# Patient Record
Sex: Female | Born: 1935
Health system: Southern US, Community
[De-identification: ages and names within clinical notes are randomized; demographics above are authoritative.]

## PROBLEM LIST (undated history)

## (undated) DIAGNOSIS — F329 Major depressive disorder, single episode, unspecified: Secondary | ICD-10-CM

## (undated) DIAGNOSIS — F419 Anxiety disorder, unspecified: Secondary | ICD-10-CM

## (undated) DIAGNOSIS — M1990 Unspecified osteoarthritis, unspecified site: Secondary | ICD-10-CM

## (undated) DIAGNOSIS — E785 Hyperlipidemia, unspecified: Secondary | ICD-10-CM

## (undated) HISTORY — DX: Unspecified osteoarthritis, unspecified site: M19.90

## (undated) HISTORY — PX: ABDOMINAL HYSTERECTOMY: SHX81

## (undated) HISTORY — DX: Hyperlipidemia, unspecified: E78.5

## (undated) HISTORY — DX: Major depressive disorder, single episode, unspecified: F32.9

## (undated) HISTORY — DX: Anxiety disorder, unspecified: F41.9

---

## 2002-03-03 LAB — HM COLONOSCOPY: HM Colonoscopy: NORMAL

## 2006-01-08 ENCOUNTER — Emergency Department (HOSPITAL_COMMUNITY): Admission: EM | Admit: 2006-01-08 | Discharge: 2006-01-09 | Payer: Self-pay | Admitting: Emergency Medicine

## 2009-12-13 ENCOUNTER — Emergency Department (HOSPITAL_COMMUNITY): Admission: EM | Admit: 2009-12-13 | Discharge: 2009-12-13 | Payer: Self-pay | Admitting: Emergency Medicine

## 2009-12-24 ENCOUNTER — Emergency Department (HOSPITAL_COMMUNITY): Admission: EM | Admit: 2009-12-24 | Discharge: 2009-12-24 | Payer: Self-pay | Admitting: Family Medicine

## 2009-12-30 ENCOUNTER — Ambulatory Visit: Payer: Self-pay | Admitting: Internal Medicine

## 2009-12-30 DIAGNOSIS — M810 Age-related osteoporosis without current pathological fracture: Secondary | ICD-10-CM | POA: Insufficient documentation

## 2009-12-30 DIAGNOSIS — M1990 Unspecified osteoarthritis, unspecified site: Secondary | ICD-10-CM | POA: Insufficient documentation

## 2009-12-30 DIAGNOSIS — E785 Hyperlipidemia, unspecified: Secondary | ICD-10-CM | POA: Insufficient documentation

## 2009-12-30 LAB — CONVERTED CEMR LAB
AST: 26 units/L (ref 0–37)
Alkaline Phosphatase: 75 units/L (ref 39–117)
BUN: 25 mg/dL — ABNORMAL HIGH (ref 6–23)
Basophils Absolute: 0 10*3/uL (ref 0.0–0.1)
Bilirubin Urine: NEGATIVE
Bilirubin, Direct: 0.1 mg/dL (ref 0.0–0.3)
CO2: 31 meq/L (ref 19–32)
Cholesterol, target level: 200 mg/dL
Cholesterol: 232 mg/dL — ABNORMAL HIGH (ref 0–200)
GFR calc non Af Amer: 46.76 mL/min (ref >60)
Glucose, Bld: 93 mg/dL (ref 70–99)
HCT: 41.8 % (ref 36.0–46.0)
HDL goal, serum: 40 mg/dL
HDL: 45.8 mg/dL (ref >39.00)
Hemoglobin, Urine: NEGATIVE
Leukocytes, UA: NEGATIVE
MCHC: 34.7 g/dL (ref 30.0–36.0)
MCV: 90.9 fL (ref 78.0–100.0)
Monocytes Relative: 7.4 % (ref 3.0–12.0)
Neutrophils Relative %: 62.5 % (ref 43.0–77.0)
Nitrite: NEGATIVE
Platelets: 327 10*3/uL (ref 150.0–400.0)
Potassium: 5.3 meq/L — ABNORMAL HIGH (ref 3.5–5.1)
Sed Rate: 30 mm/hr — ABNORMAL HIGH (ref 0–22)
Sodium: 144 meq/L (ref 135–145)
TSH: 2.14 microintl units/mL (ref 0.35–5.50)
Total Bilirubin: 0.6 mg/dL (ref 0.3–1.2)
Total CK: 95 units/L (ref 7–177)
Total Protein: 7.9 g/dL (ref 6.0–8.3)
Triglycerides: 153 mg/dL — ABNORMAL HIGH (ref 0.0–149.0)
Urobilinogen, UA: 0.2 (ref 0.0–1.0)
Vit D, 25-Hydroxy: 65 ng/mL (ref 30–89)
WBC: 8.8 10*3/uL (ref 4.5–10.5)

## 2009-12-31 ENCOUNTER — Encounter: Payer: Self-pay | Admitting: Internal Medicine

## 2010-01-03 ENCOUNTER — Encounter: Payer: Self-pay | Admitting: Internal Medicine

## 2010-01-03 ENCOUNTER — Ambulatory Visit: Payer: Self-pay | Admitting: Internal Medicine

## 2010-01-05 LAB — HM MAMMOGRAPHY: HM Mammogram: NORMAL

## 2010-01-07 ENCOUNTER — Telehealth: Payer: Self-pay | Admitting: Internal Medicine

## 2010-01-19 ENCOUNTER — Encounter: Payer: Self-pay | Admitting: Internal Medicine

## 2010-02-07 ENCOUNTER — Ambulatory Visit: Payer: Self-pay | Admitting: Internal Medicine

## 2010-02-07 LAB — CONVERTED CEMR LAB: Potassium: 4.8 meq/L (ref 3.5–5.1)

## 2010-02-08 ENCOUNTER — Telehealth: Payer: Self-pay | Admitting: Internal Medicine

## 2010-03-02 ENCOUNTER — Ambulatory Visit: Payer: Self-pay | Admitting: Internal Medicine

## 2010-04-05 ENCOUNTER — Ambulatory Visit: Payer: Self-pay | Admitting: Internal Medicine

## 2010-04-18 ENCOUNTER — Telehealth: Payer: Self-pay | Admitting: Internal Medicine

## 2010-04-21 ENCOUNTER — Ambulatory Visit: Payer: Self-pay | Admitting: Internal Medicine

## 2010-04-21 DIAGNOSIS — J3089 Other allergic rhinitis: Secondary | ICD-10-CM | POA: Insufficient documentation

## 2010-04-21 DIAGNOSIS — J019 Acute sinusitis, unspecified: Secondary | ICD-10-CM

## 2010-05-11 ENCOUNTER — Ambulatory Visit: Payer: Self-pay | Admitting: Internal Medicine

## 2010-05-11 ENCOUNTER — Encounter: Payer: Self-pay | Admitting: Internal Medicine

## 2010-05-11 ENCOUNTER — Telehealth: Payer: Self-pay | Admitting: Internal Medicine

## 2010-05-11 DIAGNOSIS — R222 Localized swelling, mass and lump, trunk: Secondary | ICD-10-CM

## 2010-05-11 DIAGNOSIS — R079 Chest pain, unspecified: Secondary | ICD-10-CM

## 2010-05-11 DIAGNOSIS — R1013 Epigastric pain: Secondary | ICD-10-CM | POA: Insufficient documentation

## 2010-05-11 DIAGNOSIS — R05 Cough: Secondary | ICD-10-CM

## 2010-05-11 LAB — CONVERTED CEMR LAB
ALT: 25 units/L (ref 0–35)
AST: 25 units/L (ref 0–37)
Albumin: 4 g/dL (ref 3.5–5.2)
Alkaline Phosphatase: 59 units/L (ref 39–117)
Amylase: 111 units/L (ref 27–131)
BUN: 33 mg/dL — ABNORMAL HIGH (ref 6–23)
Basophils Absolute: 0.1 10*3/uL (ref 0.0–0.1)
Basophils Relative: 0.8 % (ref 0.0–3.0)
Bilirubin Urine: NEGATIVE
Bilirubin, Direct: 0.1 mg/dL (ref 0.0–0.3)
Blood, UA: NEGATIVE
CO2: 30 meq/L (ref 19–32)
CRP, High Sensitivity: 0.79 (ref 0.00–5.00)
Calcium: 9.6 mg/dL (ref 8.4–10.5)
Chloride: 104 meq/L (ref 96–112)
Creatinine, Ser: 1.1 mg/dL (ref 0.4–1.2)
Eosinophils Absolute: 0.1 10*3/uL (ref 0.0–0.7)
Eosinophils Relative: 1.9 % (ref 0.0–5.0)
GFR calc non Af Amer: 50.62 mL/min — ABNORMAL LOW (ref >60.00)
Glucose, Bld: 75 mg/dL (ref 70–99)
HCT: 38.3 % (ref 36.0–46.0)
Hemoglobin: 13.2 g/dL (ref 12.0–15.0)
Ketones, ur: NEGATIVE mg/dL
Lipase: 42 units/L (ref 11.0–59.0)
Lymphocytes Relative: 26.3 % (ref 12.0–46.0)
Lymphs Abs: 2 10*3/uL (ref 0.7–4.0)
MCHC: 34.6 g/dL (ref 30.0–36.0)
MCV: 91.6 fL (ref 78.0–100.0)
Monocytes Absolute: 0.6 10*3/uL (ref 0.1–1.0)
Monocytes Relative: 8.2 % (ref 3.0–12.0)
Neutro Abs: 4.8 10*3/uL (ref 1.4–7.7)
Neutrophils Relative %: 62.8 % (ref 43.0–77.0)
Nitrite: NEGATIVE
Platelets: 316 10*3/uL (ref 150.0–400.0)
Potassium: 5.3 meq/L — ABNORMAL HIGH (ref 3.5–5.1)
Pro B Natriuretic peptide (BNP): 18.6 pg/mL (ref 0.0–100.0)
RBC: 4.18 M/uL (ref 3.87–5.11)
RDW: 13.6 % (ref 11.5–14.6)
Sodium: 141 meq/L (ref 135–145)
Specific Gravity, Urine: 1.02 (ref 1.000–1.030)
TSH: 1.58 microintl units/mL (ref 0.35–5.50)
Total Bilirubin: 0.6 mg/dL (ref 0.3–1.2)
Total CK: 66 units/L (ref 7–177)
Total Protein, Urine: NEGATIVE mg/dL
Total Protein: 7 g/dL (ref 6.0–8.3)
Urine Glucose: NEGATIVE mg/dL
Urobilinogen, UA: 0.2 (ref 0.0–1.0)
WBC: 7.7 10*3/uL (ref 4.5–10.5)
pH: 6 (ref 5.0–8.0)

## 2010-05-13 ENCOUNTER — Encounter: Payer: Self-pay | Admitting: Internal Medicine

## 2010-05-17 ENCOUNTER — Telehealth: Payer: Self-pay | Admitting: Internal Medicine

## 2010-05-17 ENCOUNTER — Ambulatory Visit: Payer: Self-pay | Admitting: Internal Medicine

## 2010-06-21 NOTE — Assessment & Plan Note (Signed)
Summary: FLU SHOT/PN  Nurse Visit   Allergies: 1)  ! Niacin (Niacin)  Orders Added: 1)  Admin 1st Vaccine [90471] 2)  Flu Vaccine 17yrs + [09811]     Flu Vaccine Consent Questions     Do you have a history of severe allergic reactions to this vaccine? no    Any prior history of allergic reactions to egg and/or gelatin? no    Do you have a sensitivity to the preservative Thimersol? no    Do you have a past history of Guillan-Barre Syndrome? no    Do you currently have an acute febrile illness? no    Have you ever had a severe reaction to latex? no    Vaccine information given and explained to patient? yes    Are you currently pregnant? no    Lot Number:[id number]   Exp Date:11/19/2010   Site Given  RIGHT Deltoid IM1

## 2010-06-21 NOTE — Assessment & Plan Note (Signed)
Summary: COUGH--CONGESTION--STC   Vital Signs:  Patient profile:   77 year old female Menstrual status:  hysterectomy Height:      59 inches Weight:      145 pounds BMI:     29.39 O2 Sat:      98 % on Room air Temp:     98.4 degrees F oral Pulse rate:   88 / minute Pulse rhythm:   regular Resp:     16 per minute BP sitting:   138 / 70  (left arm) Cuff size:   regular  Vitals Entered By: Rock Nephew CMA (April 21, 2010 11:25 AM)  Nutrition Counseling: Patient's BMI is greater than 25 and therefore counseled on weight management options.  O2 Flow:  Room air CC: Coug, congestion, bodyache, URI symptoms Is Patient Diabetic? No Pain Assessment Patient in pain? no          Menstrual Status hysterectomy   Primary Care Nickie Deren:  Etta Grandchild MD  CC:  Coug, congestion, bodyache, and URI symptoms.  History of Present Illness:  URI Symptoms      This is a 77 year old woman who presents with URI symptoms.  The symptoms began 5 days ago.  The severity is described as moderate.  The patient reports nasal congestion, purulent nasal discharge, sore throat, and productive cough, but denies dry cough, earache, and sick contacts.  The patient denies fever, stiff neck, dyspnea, wheezing, rash, vomiting, diarrhea, use of an antipyretic, and response to antipyretic.  The patient also reports sneezing, seasonal symptoms, response to antihistamine, and muscle aches.  The patient denies headache and severe fatigue.  Risk factors for Strep sinusitis include unilateral facial pain, unilateral nasal discharge, poor response to decongestant, and double sickening.  The patient denies the following risk factors for Strep sinusitis: tooth pain, Strep exposure, tender adenopathy, and absence of cough.    Preventive Screening-Counseling & Management  Alcohol-Tobacco     Alcohol drinks/day: 0     Alcohol Counseling: not indicated; patient does not drink     Smoking Status: never     Tobacco  Counseling: not indicated; no tobacco use  Hep-HIV-STD-Contraception     Hepatitis Risk: no risk noted     HIV Risk: no risk noted     STD Risk: no risk noted      Sexual History:  currently monogamous.        Drug Use:  no.        Blood Transfusions:  no.    Medications Prior to Update: 1)  Ambien 10 Mg Tabs (Zolpidem Tartrate) .Marland Kitchen.. 1 At Bedtime 2)  Tramadol Hcl 50 Mg Tabs (Tramadol Hcl) .... Every 4-6 Hours As Needed 3)  Diclofenac Sodium 75 Mg Tbec (Diclofenac Sodium) .Marland Kitchen.. 1 Two Times A Day As Needed  Current Medications (verified): 1)  Ambien 10 Mg Tabs (Zolpidem Tartrate) .Marland Kitchen.. 1 At Bedtime 2)  Tramadol Hcl 50 Mg Tabs (Tramadol Hcl) .... Every 4-6 Hours As Needed 3)  Diclofenac Sodium 75 Mg Tbec (Diclofenac Sodium) .Marland Kitchen.. 1 Two Times A Day As Needed 4)  Fish Oil 5)  Calcium 6)  Contact .Marland Kitchen.. 2tabs Qid 7)  Magnesium 8)  Vitamin C 9)  Ceftin 500 Mg Tab (Cefuroxime Axetil) .... Take One (1) Tablet By Mouth Two (2) Times A Day X 10 Days 10)  Mytussin Ac 100-10 Mg/35ml Syrp (Guaifenesin-Codeine) .... 5-10 Ml By Mouth Qid As Needed For Cough  Allergies (verified): 1)  ! Niacin (Niacin)  Past History:  Past Medical History: Last updated: 12/30/2009 Hyperlipidemia Osteoarthritis Osteoporosis  Past Surgical History: Last updated: 12/30/2009 Hysterectomy  Family History: Last updated: 12/30/2009 Family History of Arthritis Family History High cholesterol  Social History: Last updated: 12/30/2009 Retired Single Never Smoked Alcohol use-no Drug use-no Regular exercise-no  Risk Factors: Alcohol Use: 0 (04/21/2010) Exercise: no (12/30/2009)  Risk Factors: Smoking Status: never (04/21/2010)  Family History: Reviewed history from 12/30/2009 and no changes required. Family History of Arthritis Family History High cholesterol  Social History: Reviewed history from 12/30/2009 and no changes required. Retired Single Never Smoked Alcohol use-no Drug  use-no Regular exercise-no  Review of Systems       The patient complains of hoarseness.  The patient denies anorexia, fever, weight loss, weight gain, decreased hearing, chest pain, syncope, dyspnea on exertion, peripheral edema, headaches, hemoptysis, abdominal pain, hematuria, difficulty walking, depression, and enlarged lymph nodes.    Physical Exam  General:  alert, well-developed, well-nourished, well-hydrated, appropriate dress, normal appearance, healthy-appearing, cooperative to examination, and good hygiene.   Head:  normocephalic, atraumatic, no abnormalities observed, and no abnormalities palpated.   Eyes:  No corneal or conjunctival inflammation noted. EOMI. Perrla. Funduscopic exam benign, without hemorrhages, exudates or papilledema. Vision grossly normal. Ears:  R ear normal and L ear normal.   Nose:  no external deformity, no mucosal edema, no airflow obstruction, no intranasal foreign body, no nasal polyps, no nasal mucosal lesions, no mucosal friability, no active bleeding or clots, nasal dischargemucosal pallor, L maxillary sinus tenderness, and R maxillary sinus tenderness.   Mouth:  good dentition, pharynx pink and moist, no erythema, no exudates, no posterior lymphoid hypertrophy, no postnasal drip, no pharyngeal crowing, no lesions, no aphthous ulcers, no erosions, no tongue abnormalities, and no leukoplakia.   Neck:  supple, full ROM, no masses, no thyromegaly, no JVD, normal carotid upstroke, no carotid bruits, no cervical lymphadenopathy, and no neck tenderness.   Lungs:  normal respiratory effort, no intercostal retractions, no accessory muscle use, no dullness, no fremitus, no crackles, and no wheezes.  She has late expiratory bilateral rhonchi that clear with cough but very good air movement. Heart:  normal rate, regular rhythm, no murmur, no gallop, no rub, and no JVD.   Abdomen:  soft, non-tender, normal bowel sounds, no distention, no masses, no guarding, no  rigidity, no rebound tenderness, no abdominal hernia, no inguinal hernia, no hepatomegaly, and no splenomegaly.   Msk:  she has decreased ROM in her right knee and gait is antalgic Extremities:  No clubbing, cyanosis, edema, or deformity noted with normal full range of motion of all joints.   Neurologic:  No cranial nerve deficits noted. Station and gait are normal. Plantar reflexes are down-going bilaterally. DTRs are symmetrical throughout. Sensory, motor and coordinative functions appear intact. Skin:  Intact without suspicious lesions or rashes Cervical Nodes:  no anterior cervical adenopathy and no posterior cervical adenopathy.   Axillary Nodes:  no R axillary adenopathy and no L axillary adenopathy.   Psych:  Cognition and judgment appear intact. Alert and cooperative with normal attention span and concentration. No apparent delusions, illusions, hallucinations   Impression & Recommendations:  Problem # 1:  ALLERGIC RHINITIS DUE TO OTHER ALLERGEN (ICD-477.8) Assessment New try depo-medrol IM  Problem # 2:  SINUSITIS- ACUTE-NOS (ICD-461.9) Assessment: New  Her updated medication list for this problem includes:    Ceftin 500 Mg Tab (Cefuroxime axetil) .Marland Kitchen... Take one (1) tablet by mouth two (2) times a day x  10 days    Mytussin Ac 100-10 Mg/10ml Syrp (Guaifenesin-codeine) .Marland Kitchen... 5-10 ml by mouth qid as needed for cough  Instructed on treatment. Call if symptoms persist or worsen.   Complete Medication List: 1)  Ambien 10 Mg Tabs (Zolpidem tartrate) .Marland Kitchen.. 1 at bedtime 2)  Tramadol Hcl 50 Mg Tabs (Tramadol hcl) .... Every 4-6 hours as needed 3)  Diclofenac Sodium 75 Mg Tbec (Diclofenac sodium) .Marland Kitchen.. 1 two times a day as needed 4)  Fish Oil  5)  Calcium  6)  Contact  .Marland Kitchen.. 2tabs qid 7)  Magnesium  8)  Vitamin C  9)  Ceftin 500 Mg Tab (Cefuroxime axetil) .... Take one (1) tablet by mouth two (2) times a day x 10 days 10)  Mytussin Ac 100-10 Mg/58ml Syrp (Guaifenesin-codeine) .... 5-10 ml  by mouth qid as needed for cough  Patient Instructions: 1)  Please schedule a follow-up appointment in 1 month. 2)  Take your antibiotic as prescribed until ALL of it is gone, but stop if you develop a rash or swelling and contact our office as soon as possible. 3)  Acute sinusitis symptoms for less than 10 days are not helped by antibiotics.Use warm moist compresses, and over the counter decongestants ( only as directed). Call if no improvement in 5-7 days, sooner if increasing pain, fever, or new symptoms. Prescriptions: MYTUSSIN AC 100-10 MG/5ML SYRP (GUAIFENESIN-CODEINE) 5-10 ml by mouth QID as needed for cough  #8 ounces x 1   Entered and Authorized by:   Etta Grandchild MD   Signed by:   Etta Grandchild MD on 04/21/2010   Method used:   Print then Give to Patient   RxID:   [id number] CEFTIN 500 MG TAB (CEFUROXIME AXETIL) Take one (1) tablet by mouth two (2) times a day X 10 days  #28 x 1   Entered and Authorized by:   Etta Grandchild MD   Signed by:   Etta Grandchild MD on 04/21/2010   Method used:   Print then Give to Patient   RxID:   [id number]    Orders Added: 1)  Est. Patient Level IV [06301]

## 2010-06-21 NOTE — Miscellaneous (Signed)
Summary: BONE DENSITY  Clinical Lists Changes  Orders: Added new Test order of T-Lumbar Vertebral Assessment (77082) - Signed 

## 2010-06-21 NOTE — Letter (Signed)
Summary: Results Follow-up Letter  [facility]  [address] [place], [place] [address]   Phone: [phone]  Fax: [phone]    12/31/2009  [address] [place], [place]  [address]  Dear Ms. Butzer,   The following are the results of your recent test(s):  Test     Result     Potassium level   a little high Liver/kidney   normal CBC       normal Thyroid     normal   _________________________________________________________  Please call for an appointment soon to recheck the potassium level _________________________________________________________ _________________________________________________________ _________________________________________________________  Sincerely,  Sanda Linger MD [facility]

## 2010-06-21 NOTE — Progress Notes (Signed)
    Mammogram Screening:    Last Mammogram:  01/05/2010  Mammogram Results:    Date of Exam:  01/05/2010    Results:  Normal Bilateral  Osteoporosis Risk Assessment:  Risk Factors for Fracture or Low Bone Density:   Race (White or Asian):     yes   Smoking status:       never

## 2010-06-21 NOTE — Therapy (Signed)
Summary: [facility]  [facility]   Imported By: Lester Graham 01/26/2010 10:39:46  _____________________________________________________________________  External Attachment:    Type:   Image     Comment:   External Document

## 2010-06-21 NOTE — Progress Notes (Signed)
  Phone Note Call from Patient Call back at Home Phone [phone]   Summary of Call: Pt c/o being "sick as a dog" and can not come into the office. She is req an rx from MD - need to call pt for further details Initial call taken by: Lamar Sprinkles, CMA,  April 18, 2010 10:05 AM  Follow-up for Phone Call        Pt c/o sinus congestion and "hacking" cough. No fever and mucus is clear. Pt is going to try OTC med her daughter gave her. Advised to call back and schedule eval if she does not continue to improve, sinus pain, discolored mucus, fever or any change in symptoms.  Follow-up by: Lamar Sprinkles, CMA,  April 18, 2010 3:22 PM

## 2010-06-21 NOTE — Assessment & Plan Note (Signed)
Summary: NEW/ MEDICARE [employer]/ [employer] #   Vital Signs:  Patient profile:   77 year old female Height:      59 inches Weight:      144 pounds BMI:     29.19 O2 Sat:      98 % Temp:     98.1 degrees F oral Pulse rate:   80 / minute Pulse rhythm:   regular Resp:     16 per minute BP sitting:   130 / 90  (left arm) Cuff size:   regular  Vitals Entered By: Lanier Prude, CMA(AAMA) (December 30, 2009 1:16 PM)  Nutrition Counseling: Patient's BMI is greater than 25 and therefore counseled on weight management options. CC: est PCP/knee pain, Preventive Care, Lipid Management Is Patient Diabetic? No Comments pt states she needs referral to bone specialist   Primary Care Shetara Launer:  Etta Grandchild MD  CC:  est PCP/knee pain, Preventive Care, and Lipid Management.  History of Present Illness: New to me she needs a new PCP. She has severe DJD in her right knee and she wants to have a TKR. She has no old records.  Lipid Management History:      Positive NCEP/ATP III risk factors include female age 43 years old or older.  Negative NCEP/ATP III risk factors include no history of early menopause without estrogen hormone replacement, non-diabetic, no family history for ischemic heart disease, non-tobacco-user status, non-hypertensive, no ASHD (atherosclerotic heart disease), no prior stroke/TIA, no peripheral vascular disease, and no history of aortic aneurysm.        The patient states that she knows about the "Therapeutic Lifestyle Change" diet.  Her compliance with the TLC diet is good.  The patient expresses understanding of adjunctive measures for cholesterol lowering.  Adjunctive measures started by the patient include aerobic exercise, fiber, ASA, limit alcohol consumpton, and weight reduction.  She expresses no side effects from her lipid-lowering medication.  The patient denies any symptoms to suggest myopathy or liver disease.     Preventive Screening-Counseling &  Management  Alcohol-Tobacco     Alcohol drinks/day: 0     Smoking Status: never  Caffeine-Diet-Exercise     Does Patient Exercise: no  Hep-HIV-STD-Contraception     Hepatitis Risk: no risk noted     HIV Risk: no risk noted     STD Risk: no risk noted  Safety-Violence-Falls     Seat Belt Use: yes     Helmet Use: yes     Firearms in the Home: no firearms in the home     Smoke Detectors: no     Violence in the Home: no risk noted      Sexual History:  currently monogamous.        Drug Use:  no.        Blood Transfusions:  no.    Current Medications (verified): 1)  Ambien 10 Mg Tabs (Zolpidem Tartrate) .Marland Kitchen.. 1 At Bedtime 2)  Tramadol Hcl 50 Mg Tabs (Tramadol Hcl) .... Every 4-6 Hours As Needed 3)  Pravachol 40 Mg Tabs (Pravastatin Sodium) .Marland Kitchen.. 1 Once Daily 4)  Diclofenac Sodium 75 Mg Tbec (Diclofenac Sodium) .Marland Kitchen.. 1 Two Times A Day As Needed 5)  Fosamax 70 Mg Tabs (Alendronate Sodium) .Marland Kitchen.. 1 Weekly  Allergies (verified): 1)  ! Niacin (Niacin)  Past History:  Past Medical History: Hyperlipidemia Osteoarthritis Osteoporosis  Past Surgical History: Hysterectomy  Family History: Family History of Arthritis Family History High cholesterol  Social History: Retired Single Never  Smoked Alcohol use-no Drug use-no Regular exercise-no Smoking Status:  never Hepatitis Risk:  no risk noted HIV Risk:  no risk noted STD Risk:  no risk noted Seat Belt Use:  yes Sexual History:  currently monogamous Blood Transfusions:  no Drug Use:  no Does Patient Exercise:  no  Review of Systems       The patient complains of weight gain and difficulty walking.  The patient denies anorexia, fever, weight loss, chest pain, syncope, dyspnea on exertion, peripheral edema, prolonged cough, headaches, hemoptysis, abdominal pain, severe indigestion/heartburn, hematuria, muscle weakness, suspicious skin lesions, depression, enlarged lymph nodes, and angioedema.   MS:  Complains of joint  pain, low back pain, and stiffness; denies joint redness, joint swelling, loss of strength, mid back pain, muscle aches, muscle, and cramps. Psych:  Denies alternate hallucination ( auditory/visual), anxiety, depression, easily angered, easily tearful, irritability, panic attacks, sense of great danger, suicidal thoughts/plans, thoughts of violence, unusual visions or sounds, and thoughts /plans of harming others.  Physical Exam  General:  alert, well-developed, well-nourished, well-hydrated, appropriate dress, normal appearance, and healthy-appearing.   Head:  normocephalic, atraumatic, no abnormalities observed, and no abnormalities palpated.   Eyes:  vision grossly intact, pupils equal, pupils round, and pupils reactive to light.   Ears:  R ear normal and L ear normal.   Mouth:  Oral mucosa and oropharynx without lesions or exudates.  Teeth in good repair. Neck:  supple, full ROM, no masses, no thyromegaly, no JVD, normal carotid upstroke, no carotid bruits, no cervical lymphadenopathy, and no neck tenderness.   Lungs:  normal respiratory effort, no intercostal retractions, no accessory muscle use, normal breath sounds, no dullness, no fremitus, no crackles, and no wheezes.   Heart:  normal rate, regular rhythm, no murmur, no gallop, no rub, and no JVD.   Abdomen:  soft, non-tender, normal bowel sounds, no distention, no masses, no guarding, no rigidity, no rebound tenderness, no abdominal hernia, no inguinal hernia, no hepatomegaly, and no splenomegaly.   Msk:  she has decreased ROM in her right knee and gait is antalgic Pulses:  R and L carotid,radial,femoral,dorsalis pedis and posterior tibial pulses are full and equal bilaterally Extremities:  No clubbing, cyanosis, edema, or deformity noted with normal full range of motion of all joints.   Neurologic:  No cranial nerve deficits noted. Station and gait are normal. Plantar reflexes are down-going bilaterally. DTRs are symmetrical throughout.  Sensory, motor and coordinative functions appear intact. Skin:  Intact without suspicious lesions or rashes Cervical Nodes:  no anterior cervical adenopathy and no posterior cervical adenopathy.   Axillary Nodes:  no R axillary adenopathy and no L axillary adenopathy.   Psych:  Cognition and judgment appear intact. Alert and cooperative with normal attention span and concentration. No apparent delusions, illusions, hallucinations   Impression & Recommendations:  Problem # 1:  OSTEOPOROSIS (ICD-733.00) Assessment Unchanged  Her updated medication list for this problem includes:    Fosamax 70 Mg Tabs (Alendronate sodium) .Marland Kitchen... 1 weekly  Orders: Venipuncture ([address]109) TLB-Lipid Panel (80061-LIPID) TLB-BMP (Basic Metabolic Panel-BMET) (80048-METABOL) TLB-CBC Platelet - w/Differential (85025-CBCD) TLB-Hepatic/Liver Function Pnl (80076-HEPATIC) TLB-TSH (Thyroid Stimulating Hormone) (84443-TSH) TLB-CK Total Only(Creatine Kinase/CPK) (82550-CK) TLB-Sedimentation Rate (ESR) (85652-ESR) TLB-Udip w/ Micro (81001-URINE) T-Vitamin D (25-Hydroxy) (60454-09811) T-Bone Densitometry (91478)  Problem # 2:  OSTEOARTHRITIS (ICD-715.90) Assessment: Deteriorated  Her updated medication list for this problem includes:    Tramadol Hcl 50 Mg Tabs (Tramadol hcl) ..... Every 4-6 hours as needed    Diclofenac  Sodium 75 Mg Tbec (Diclofenac sodium) .Marland Kitchen... 1 two times a day as needed  Orders: Venipuncture (21308) TLB-Lipid Panel (80061-LIPID) TLB-BMP (Basic Metabolic Panel-BMET) (80048-METABOL) TLB-CBC Platelet - w/Differential (85025-CBCD) TLB-Hepatic/Liver Function Pnl (80076-HEPATIC) TLB-TSH (Thyroid Stimulating Hormone) (84443-TSH) TLB-CK Total Only(Creatine Kinase/CPK) (82550-CK) TLB-Sedimentation Rate (ESR) (85652-ESR) TLB-Udip w/ Micro (81001-URINE) T-Vitamin D (25-Hydroxy) (65784-69629) Orthopedic Referral (Ortho)  Problem # 3:  HYPERLIPIDEMIA (ICD-272.4) Assessment: Unchanged  Her updated  medication list for this problem includes:    Pravachol 40 Mg Tabs (Pravastatin sodium) .Marland Kitchen... 1 once daily  Orders: Venipuncture (52841) TLB-Lipid Panel (80061-LIPID) TLB-BMP (Basic Metabolic Panel-BMET) (80048-METABOL) TLB-CBC Platelet - w/Differential (85025-CBCD) TLB-Hepatic/Liver Function Pnl (80076-HEPATIC) TLB-TSH (Thyroid Stimulating Hormone) (84443-TSH) TLB-CK Total Only(Creatine Kinase/CPK) (82550-CK) TLB-Sedimentation Rate (ESR) (85652-ESR) TLB-Udip w/ Micro (81001-URINE) T-Vitamin D (25-Hydroxy) (32440-10272)  Lipid Goals: Chol Goal: 200 (12/30/2009)   HDL Goal: 40 (12/30/2009)   LDL Goal: 160 (12/30/2009)   TG Goal: 150 (12/30/2009)  10 Yr Risk Heart Disease: Not enough information  Complete Medication List: 1)  Ambien 10 Mg Tabs (Zolpidem tartrate) .Marland Kitchen.. 1 at bedtime 2)  Tramadol Hcl 50 Mg Tabs (Tramadol hcl) .... Every 4-6 hours as needed 3)  Pravachol 40 Mg Tabs (Pravastatin sodium) .Marland Kitchen.. 1 once daily 4)  Diclofenac Sodium 75 Mg Tbec (Diclofenac sodium) .Marland Kitchen.. 1 two times a day as needed 5)  Fosamax 70 Mg Tabs (Alendronate sodium) .Marland KitchenMarland KitchenMarland Kitchen 1 weekly  Other Orders: Radiology Referral (Radiology)  Lipid Assessment/Plan:      Based on NCEP/ATP III, the patient's risk factor category is "0-1 risk factors".  The patient's lipid goals are as follows: Total cholesterol goal is 200; LDL cholesterol goal is 160; HDL cholesterol goal is 40; Triglyceride goal is 150.    Colorectal Screening:  Colonoscopy Results:    Date of Exam: 03/03/2002    Results: Normal  Osteoporosis Risk Assessment:  Risk Factors for Fracture or Low Bone Density:   Race (White or Asian):     yes   Smoking status:       never  Patient Instructions: 1)  Please schedule a follow-up appointment in 2 months. 2)  It is important that you exercise regularly at least 20 minutes 5 times a week. If you develop chest pain, have severe difficulty breathing, or feel very tired , stop exercising immediately and  seek medical attention. 3)  You need to lose weight. Consider a lower calorie diet and regular exercise.  4)  Schedule your mammogram. 5)  Schedule a colonoscopy/sigmoidoscopy to help detect colon cancer. 6)  You need to have a Pap Smear to prevent cervical cancer. Prescriptions: FOSAMAX 70 MG TABS (ALENDRONATE SODIUM) 1 weekly  #4 x 11   Entered and Authorized by:   Etta Grandchild MD   Signed by:   Etta Grandchild MD on 12/30/2009   Method used:   Print then Give to Patient   RxID:   [id number] DICLOFENAC SODIUM 75 MG TBEC (DICLOFENAC SODIUM) 1 two times a day as needed  #60 x 11   Entered and Authorized by:   Etta Grandchild MD   Signed by:   Etta Grandchild MD on 12/30/2009   Method used:   Print then Give to Patient   RxID:   [id number] PRAVACHOL 40 MG TABS (PRAVASTATIN SODIUM) 1 once daily  #30 x 11   Entered and Authorized by:   Etta Grandchild MD   Signed by:   Etta Grandchild MD on 12/30/2009   Method used:  Print then Give to Patient   RxID:   [id number] TRAMADOL HCL 50 MG TABS (TRAMADOL HCL) every 4-6 hours as needed  #100 x 11   Entered and Authorized by:   Etta Grandchild MD   Signed by:   Etta Grandchild MD on 12/30/2009   Method used:   Print then Give to Patient   RxID:   [id number] AMBIEN 10 MG TABS (ZOLPIDEM TARTRATE) 1 at bedtime  #30 x 5   Entered and Authorized by:   Etta Grandchild MD   Signed by:   Etta Grandchild MD on 12/30/2009   Method used:   Print then Give to Patient   RxID:   [id number]

## 2010-06-21 NOTE — Progress Notes (Signed)
Summary: RESULTS  Phone Note Call from Patient Call back at Home Phone [phone]   Summary of Call: Patient is requesting results of potassium.  Initial call taken by: Lamar Sprinkles, CMA,  February 08, 2010 12:08 PM  Follow-up for Phone Call        normal Follow-up by: Etta Grandchild MD,  February 08, 2010 12:09 PM  Additional Follow-up for Phone Call Additional follow up Details #1::        Pt informed Additional Follow-up by: Margaret Pyle, CMA,  February 09, 2010 10:46 AM

## 2010-06-21 NOTE — Letter (Signed)
Summary: Lipid Letter  [facility]  [address] [place], [place] [address]   Phone: [phone]  Fax: [phone]    12/31/2009  Veora Fonte [address] [place], [place]  [address]  Dear Ms. Clovis Riley:  We have carefully reviewed your last lipid profile from  and the results are noted below with a summary of recommendations for lipid management.    Cholesterol:       232     Goal: <200   HDL "good" Cholesterol:   84.13     Goal: >40   LDL "bad" Cholesterol:   163     Goal: <160   Triglycerides:       153.0     Goal: <150        TLC Diet (Therapeutic Lifestyle Change): Saturated Fats & Transfatty acids should be kept < 7% of total calories ***Reduce Saturated Fats Polyunstaurated Fat can be up to 10% of total calories Monounsaturated Fat Fat can be up to 20% of total calories Total Fat should be no greater than 25-35% of total calories Carbohydrates should be 50-60% of total calories Protein should be approximately 15% of total calories Fiber should be at least 20-30 grams a day ***Increased fiber may help lower LDL Total Cholesterol should be < 200mg /day Consider adding plant stanol/sterols to diet (example: Benacol spread) ***A higher intake of unsaturated fat may reduce Triglycerides and Increase HDL    Adjunctive Measures (may lower LIPIDS and reduce risk of Heart Attack) include: Aerobic Exercise (20-30 minutes 3-4 times a week) Limit Alcohol Consumption Weight Reduction Aspirin 75-81 mg a day by mouth (if not allergic or contraindicated) Dietary Fiber 20-30 grams a day by mouth     Current Medications: 1)    Ambien 10 Mg Tabs (Zolpidem tartrate) .Marland Kitchen.. 1 at bedtime 2)    Tramadol Hcl 50 Mg Tabs (Tramadol hcl) .... Every 4-6 hours as needed 3)    Pravachol 40 Mg Tabs (Pravastatin sodium) .Marland Kitchen.. 1 once daily 4)    Diclofenac Sodium 75 Mg Tbec (Diclofenac sodium) .Marland Kitchen.. 1 two times a day as needed 5)    Fosamax 70 Mg Tabs (Alendronate sodium) .Marland KitchenMarland KitchenMarland Kitchen 1  weekly  If you have any questions, please call. We appreciate being able to work with you.   Sincerely,    [facility] Etta Grandchild MD

## 2010-06-21 NOTE — Assessment & Plan Note (Signed)
Summary: 2 MO ROV /NWS  #   Vital Signs:  Patient profile:   77 year old female Height:      59 inches Weight:      144.25 pounds BMI:     29.24 O2 Sat:      97 % on Room air Temp:     98.1 degrees F oral Pulse rate:   74 / minute Pulse rhythm:   regular Resp:     16 per minute BP sitting:   130 / 72  (left arm) Cuff size:   large  Vitals Entered By: Rock Nephew CMA (March 02, 2010 3:33 PM)  Nutrition Counseling: Patient's BMI is greater than 25 and therefore counseled on weight management options.  O2 Flow:  Room air CC: follow-up visit//  Lipid Management Is Patient Diabetic? No Pain Assessment Patient in pain? no       Does patient need assistance? Functional Status Self care Ambulation Normal   Primary Care Provider:  Etta Grandchild MD  CC:  follow-up visit//  Lipid Management.  History of Present Illness:  Follow-Up Visit      This is a 77 year old woman who presents for Follow-up visit.  The patient denies chest pain, palpitations, dizziness, syncope, edema, SOB, DOE, PND, and orthopnea.  Since the last visit the patient notes problems with medications.  The patient reports taking meds as prescribed.  When questioned about possible medication side effects, the patient notes muscle aches.    Lipid Management History:      Positive NCEP/ATP III risk factors include female age 17 years old or older.  Negative NCEP/ATP III risk factors include no history of early menopause without estrogen hormone replacement, non-diabetic, no family history for ischemic heart disease, non-tobacco-user status, non-hypertensive, no ASHD (atherosclerotic heart disease), no prior stroke/TIA, no peripheral vascular disease, and no history of aortic aneurysm.        The patient states that she knows about the "Therapeutic Lifestyle Change" diet.  Her compliance with the TLC diet is good.  The patient expresses understanding of adjunctive measures for cholesterol lowering.  Adjunctive  measures started by the patient include aerobic exercise, fiber, limit alcohol consumpton, and weight reduction.  She notes side effects from her lipid-lowering medication.  The patient notes symptoms to suggest myopathy or liver disease.     Preventive Screening-Counseling & Management  Alcohol-Tobacco     Alcohol drinks/day: 0     Smoking Status: never     Tobacco Counseling: not indicated; no tobacco use  Hep-HIV-STD-Contraception     Hepatitis Risk: no risk noted     HIV Risk: no risk noted     STD Risk: no risk noted      Sexual History:  currently monogamous.        Drug Use:  no.        Blood Transfusions:  no.    Clinical Review Panels:  Prevention   Last Mammogram:  Normal Bilateral (01/05/2010)   Last Colonoscopy:  Normal (03/03/2002)  Lipid Management   Cholesterol:  232 (12/30/2009)   HDL (good cholesterol):  45.80 (12/30/2009)  Diabetes Management   Creatinine:  1.2 (12/30/2009)  CBC   WBC:  8.8 (12/30/2009)   RBC:  4.60 (12/30/2009)   Hgb:  14.5 (12/30/2009)   Hct:  41.8 (12/30/2009)   Platelets:  327.0 (12/30/2009)   MCV  90.9 (12/30/2009)   MCHC  34.7 (12/30/2009)   RDW  12.6 (12/30/2009)   PMN:  62.5 (  12/30/2009)   Lymphs:  28.8 (12/30/2009)   Monos:  7.4 (12/30/2009)   Eosinophils:  1.0 (12/30/2009)   Basophil:  0.3 (12/30/2009)  Complete Metabolic Panel   Glucose:  93 (12/30/2009)   Sodium:  144 (12/30/2009)   Potassium:  4.8 (02/07/2010)   Chloride:  102 (12/30/2009)   CO2:  31 (12/30/2009)   BUN:  25 (12/30/2009)   Creatinine:  1.2 (12/30/2009)   Albumin:  4.6 (12/30/2009)   Total Protein:  7.9 (12/30/2009)   Calcium:  10.5 (12/30/2009)   Total Bili:  0.6 (12/30/2009)   Alk Phos:  75 (12/30/2009)   SGPT (ALT):  20 (12/30/2009)   SGOT (AST):  26 (12/30/2009)   Medications Prior to Update: 1)  Ambien 10 Mg Tabs (Zolpidem Tartrate) .Marland Kitchen.. 1 At Bedtime 2)  Tramadol Hcl 50 Mg Tabs (Tramadol Hcl) .... Every 4-6 Hours As Needed 3)   Pravachol 40 Mg Tabs (Pravastatin Sodium) .Marland Kitchen.. 1 Once Daily 4)  Diclofenac Sodium 75 Mg Tbec (Diclofenac Sodium) .Marland Kitchen.. 1 Two Times A Day As Needed 5)  Fosamax 70 Mg Tabs (Alendronate Sodium) .Marland Kitchen.. 1 Weekly  Current Medications (verified): 1)  Ambien 10 Mg Tabs (Zolpidem Tartrate) .Marland Kitchen.. 1 At Bedtime 2)  Tramadol Hcl 50 Mg Tabs (Tramadol Hcl) .... Every 4-6 Hours As Needed 3)  Diclofenac Sodium 75 Mg Tbec (Diclofenac Sodium) .Marland Kitchen.. 1 Two Times A Day As Needed  Allergies (verified): 1)  ! Niacin (Niacin)  Past History:  Past Medical History: Last updated: 12/30/2009 Hyperlipidemia Osteoarthritis Osteoporosis  Past Surgical History: Last updated: 12/30/2009 Hysterectomy  Family History: Last updated: 12/30/2009 Family History of Arthritis Family History High cholesterol  Social History: Last updated: 12/30/2009 Retired Single Never Smoked Alcohol use-no Drug use-no Regular exercise-no  Risk Factors: Alcohol Use: 0 (03/02/2010) Exercise: no (12/30/2009)  Risk Factors: Smoking Status: never (03/02/2010)  Family History: Reviewed history from 12/30/2009 and no changes required. Family History of Arthritis Family History High cholesterol  Social History: Reviewed history from 12/30/2009 and no changes required. Retired Single Never Smoked Alcohol use-no Drug use-no Regular exercise-no  Review of Systems  The patient denies weight loss, weight gain, chest pain, dyspnea on exertion, peripheral edema, prolonged cough, headaches, hemoptysis, abdominal pain, muscle weakness, suspicious skin lesions, and depression.    Physical Exam  General:  alert, well-developed, well-nourished, well-hydrated, appropriate dress, normal appearance, and healthy-appearing.   Mouth:  Oral mucosa and oropharynx without lesions or exudates.  Teeth in good repair. Neck:  supple, full ROM, no masses, no thyromegaly, no JVD, normal carotid upstroke, no carotid bruits, no cervical  lymphadenopathy, and no neck tenderness.   Lungs:  normal respiratory effort, no intercostal retractions, no accessory muscle use, normal breath sounds, no dullness, no fremitus, no crackles, and no wheezes.   Heart:  normal rate, regular rhythm, no murmur, no gallop, no rub, and no JVD.   Abdomen:  soft, non-tender, normal bowel sounds, no distention, no masses, no guarding, no rigidity, no rebound tenderness, no abdominal hernia, no inguinal hernia, no hepatomegaly, and no splenomegaly.   Msk:  she has decreased ROM in her right knee and gait is antalgic Pulses:  R and L carotid,radial,femoral,dorsalis pedis and posterior tibial pulses are full and equal bilaterally Extremities:  No clubbing, cyanosis, edema, or deformity noted with normal full range of motion of all joints.   Neurologic:  No cranial nerve deficits noted. Station and gait are normal. Plantar reflexes are down-going bilaterally. DTRs are symmetrical throughout. Sensory, motor and coordinative functions  appear intact. Skin:  Intact without suspicious lesions or rashes Cervical Nodes:  no anterior cervical adenopathy and no posterior cervical adenopathy.   Psych:  Cognition and judgment appear intact. Alert and cooperative with normal attention span and concentration. No apparent delusions, illusions, hallucinations   Impression & Recommendations:  Problem # 1:  OSTEOPOROSIS (ICD-733.00) Assessment Improved she does not need fosamax anymore The following medications were removed from the medication list:    Fosamax 70 Mg Tabs (Alendronate sodium) .Marland Kitchen... 1 weekly  Discussed  exercises.   Problem # 2:  HYPERLIPIDEMIA (ICD-272.4) Assessment: Deteriorated will stop this to see if her muscle and joint aches improve The following medications were removed from the medication list:    Pravachol 40 Mg Tabs (Pravastatin sodium) .Marland Kitchen... 1 once daily  Complete Medication List: 1)  Ambien 10 Mg Tabs (Zolpidem tartrate) .Marland Kitchen.. 1 at  bedtime 2)  Tramadol Hcl 50 Mg Tabs (Tramadol hcl) .... Every 4-6 hours as needed 3)  Diclofenac Sodium 75 Mg Tbec (Diclofenac sodium) .Marland Kitchen.. 1 two times a day as needed  Lipid Assessment/Plan:      Based on NCEP/ATP III, the patient's risk factor category is "0-1 risk factors".  The patient's lipid goals are as follows: Total cholesterol goal is 200; LDL cholesterol goal is 160; HDL cholesterol goal is 40; Triglyceride goal is 150.    Patient Instructions: 1)  Please schedule a follow-up appointment in 1 month. 2)  It is important that you exercise regularly at least 20 minutes 5 times a week. If you develop chest pain, have severe difficulty breathing, or feel very tired , stop exercising immediately and seek medical attention. 3)  You need to lose weight. Consider a lower calorie diet and regular exercise.  4)  Take calcium +Vitamin D daily.

## 2010-06-23 NOTE — Progress Notes (Signed)
Summary: RESULTS  Phone Note Outgoing Call   Summary of Call: LA - please tell her that the chest xray showed a nodule but no pneumonia, I have ordered a CT scan of her chest  TJ Initial call taken by: Etta Grandchild MD,  May 11, 2010 3:29 PM  Follow-up for Phone Call        Called pt, no personal VM.Marland Kitchenwill try again later.Alvy Beal Archie CMA  May 11, 2010 4:27 PM   Hm # is [facility], chose option for front desk & they transferred me to VM, left vm to call office back........Marland KitchenLamar Sprinkles, CMA  May 11, 2010 6:16 PM   Additional Follow-up for Phone Call Additional follow up Details #1::        Patient notified .Marland KitchenAlvy Beal Archie CMA  May 12, 2010 8:21 AM   New Problems: MASS, LUNG (ICD-786.6)   New Problems: MASS, LUNG (ICD-786.6)

## 2010-06-23 NOTE — Assessment & Plan Note (Signed)
Summary: NAUSEA--CAN'T KEEP FOOD ON STOMACH  STC   Vital Signs:  Patient profile:   77 year old female Menstrual status:  hysterectomy Height:      59 inches Weight:      146 pounds BMI:     29.60 O2 Sat:      96 % on Room air Temp:     98.3 degrees F rectal Pulse rate:   85 / minute Pulse rhythm:   regular Resp:     16 per minute BP sitting:   140 / 86  (left arm) Cuff size:   regular  Vitals Entered By: Rock Nephew CMA (May 11, 2010 2:08 PM)  Nutrition Counseling: Patient's BMI is greater than 25 and therefore counseled on weight management options.  O2 Flow:  Room air CC: Patient c/o nausea, congestion, sore throat, and fatigue, Diarrhea, Is Patient Diabetic? No Pain Assessment Patient in pain? no       Does patient need assistance? Functional Status Self care Ambulation Normal   Primary Care Provider:  Etta Grandchild MD  CC:  Patient c/o nausea, congestion, sore throat, and fatigue, Diarrhea, and .  History of Present Illness:  Diarrhea      This is a 77 year old woman who presents with Diarrhea.  The symptoms began 1 week ago.  The severity is described as mild.  The patient reports 4-6 stools per day, watery/unformed stools, and abrupt onset of symptoms, but denies voluminous stools, blood in stool, mucus in stool, greasy stools, malodorous stools, fecal urgency, fecal soiling, alternating diarrhea/constipation, nocturnal diarrhea, fasting diarrhea, bloating, gassiness, and gradual onset of symptoms.  Associated symptoms include abdominal pain, abdominal cramps, and nausea.  The patient denies fever, vomiting, lightheadedness, increased thirst, weight loss, joint pains, mouth ulcers, and eye redness.  The symptoms are worse with any food.  The symptoms are better with fasting.  Patient's risk factors for diarrhea include recent antibiotic use.         The patient also complains of Chest pain.  The symptoms began 2 weeks ago.  On a scale of 1 to 10, the  intensity is described as a 2.  The patient reports resting chest pain and nausea, but denies exertional chest pain, vomiting, diaphoresis, shortness of breath, palpitations, and dizziness.  The pain is described as intermittent and sharp.  The pain is located in the substernal area and the pain does not radiate.  Episodes of chest pain last 1-2 minutes.  The pain is brought on or made worse by coughing.  The pain is relieved or improved with change in position.    Preventive Screening-Counseling & Management  Alcohol-Tobacco     Alcohol drinks/day: 0     Alcohol Counseling: not indicated; patient does not drink     Smoking Status: never     Tobacco Counseling: not indicated; no tobacco use  Hep-HIV-STD-Contraception     Hepatitis Risk: no risk noted     HIV Risk: no risk noted     STD Risk: no risk noted      Sexual History:  currently monogamous.        Drug Use:  no.        Blood Transfusions:  no.    Clinical Review Panels:  Prevention   Last Mammogram:  Normal Bilateral (01/05/2010)   Last Colonoscopy:  Normal (03/03/2002)  Immunizations   Last Flu Vaccine:  Fluvax 3+ (04/05/2010)  Lipid Management   Cholesterol:  232 (12/30/2009)   HDL (  good cholesterol):  45.80 (12/30/2009)  Diabetes Management   Creatinine:  1.2 (12/30/2009)   Last Flu Vaccine:  Fluvax 3+ (04/05/2010)  CBC   WBC:  8.8 (12/30/2009)   RBC:  4.60 (12/30/2009)   Hgb:  14.5 (12/30/2009)   Hct:  41.8 (12/30/2009)   Platelets:  327.0 (12/30/2009)   MCV  90.9 (12/30/2009)   MCHC  34.7 (12/30/2009)   RDW  12.6 (12/30/2009)   PMN:  62.5 (12/30/2009)   Lymphs:  28.8 (12/30/2009)   Monos:  7.4 (12/30/2009)   Eosinophils:  1.0 (12/30/2009)   Basophil:  0.3 (12/30/2009)  Complete Metabolic Panel   Glucose:  93 (12/30/2009)   Sodium:  144 (12/30/2009)   Potassium:  4.8 (02/07/2010)   Chloride:  102 (12/30/2009)   CO2:  31 (12/30/2009)   BUN:  25 (12/30/2009)   Creatinine:  1.2 (12/30/2009)    Albumin:  4.6 (12/30/2009)   Total Protein:  7.9 (12/30/2009)   Calcium:  10.5 (12/30/2009)   Total Bili:  0.6 (12/30/2009)   Alk Phos:  75 (12/30/2009)   SGPT (ALT):  20 (12/30/2009)   SGOT (AST):  26 (12/30/2009)   Medications Prior to Update: 1)  Ambien 10 Mg Tabs (Zolpidem Tartrate) .Marland Kitchen.. 1 At Bedtime 2)  Tramadol Hcl 50 Mg Tabs (Tramadol Hcl) .... Every 4-6 Hours As Needed 3)  Diclofenac Sodium 75 Mg Tbec (Diclofenac Sodium) .Marland Kitchen.. 1 Two Times A Day As Needed 4)  Fish Oil 5)  Calcium 6)  Contact .Marland Kitchen.. 2tabs Qid 7)  Magnesium 8)  Vitamin C 9)  Mytussin Ac 100-10 Mg/23ml Syrp (Guaifenesin-Codeine) .... 5-10 Ml By Mouth Qid As Needed For Cough  Current Medications (verified): 1)  Ambien 10 Mg Tabs (Zolpidem Tartrate) .Marland Kitchen.. 1 At Bedtime 2)  Tramadol Hcl 50 Mg Tabs (Tramadol Hcl) .... Every 4-6 Hours As Needed 3)  Diclofenac Sodium 75 Mg Tbec (Diclofenac Sodium) .Marland Kitchen.. 1 Two Times A Day As Needed 4)  Fish Oil 5)  Calcium 6)  Contact .Marland Kitchen.. 2tabs Qid 7)  Magnesium 8)  Vitamin C 9)  Glucosamine-Chondroitin .... Take 1 Tablet By Mouth Two Times A Day 10)  Cefuroxime Axetil 500 Mg Tabs (Cefuroxime Axetil) .... Take 1 Tablet By Mouth Two Times A Day 11)  Promethazine Hcl 25 Mg Tabs (Promethazine Hcl) .... 1/2-1 By Mouth Qid As Needed For Nausea  Allergies (verified): 1)  ! Niacin (Niacin)  Past History:  Past Medical History: Last updated: 12/30/2009 Hyperlipidemia Osteoarthritis Osteoporosis  Past Surgical History: Last updated: 12/30/2009 Hysterectomy  Family History: Last updated: 12/30/2009 Family History of Arthritis Family History High cholesterol  Social History: Last updated: 12/30/2009 Retired Single Never Smoked Alcohol use-no Drug use-no Regular exercise-no  Risk Factors: Alcohol Use: 0 (05/11/2010) Exercise: no (12/30/2009)  Risk Factors: Smoking Status: never (05/11/2010)  Family History: Reviewed history from 12/30/2009 and no changes  required. Family History of Arthritis Family History High cholesterol  Social History: Reviewed history from 12/30/2009 and no changes required. Retired Single Never Smoked Alcohol use-no Drug use-no Regular exercise-no  Review of Systems       The patient complains of chest pain, prolonged cough, and abdominal pain.  The patient denies anorexia, fever, weight loss, weight gain, hoarseness, syncope, dyspnea on exertion, peripheral edema, headaches, hemoptysis, melena, hematochezia, severe indigestion/heartburn, hematuria, suspicious skin lesions, difficulty walking, enlarged lymph nodes, angioedema, and breast masses.   Resp:  Complains of chest discomfort and cough; denies chest pain with inspiration, coughing up blood, excessive snoring, hypersomnolence, morning headaches,  pleuritic, shortness of breath, sputum productive, and wheezing. GI:  Complains of abdominal pain, change in bowel habits, diarrhea, gas, and nausea; denies bloody stools, constipation, dark tarry stools, indigestion, loss of appetite, vomiting, vomiting blood, and yellowish skin color.  Physical Exam  General:  alert, well-developed, well-nourished, well-hydrated, appropriate dress, normal appearance, healthy-appearing, cooperative to examination, and good hygiene.   Head:  normocephalic, atraumatic, no abnormalities observed, and no abnormalities palpated.   Eyes:  No corneal or conjunctival inflammation noted. EOMI. Perrla. Funduscopic exam benign, without hemorrhages, exudates or papilledema. Vision grossly normal. Mouth:  good dentition, pharynx pink and moist, no erythema, no exudates, no posterior lymphoid hypertrophy, no postnasal drip, no pharyngeal crowing, no lesions, no aphthous ulcers, no erosions, no tongue abnormalities, and no leukoplakia.   Neck:  supple, full ROM, no masses, no thyromegaly, no JVD, normal carotid upstroke, no carotid bruits, no cervical lymphadenopathy, and no neck tenderness.   Lungs:   normal respiratory effort, no intercostal retractions, no accessory muscle use, normal breath sounds, no dullness, no fremitus, no crackles, and no wheezes.   Heart:  normal rate, regular rhythm, no murmur, no gallop, no rub, and no JVD.   Abdomen:  soft, non-tender, no distention, no masses, no guarding, no rigidity, no rebound tenderness, no abdominal hernia, no inguinal hernia, no hepatomegaly, no splenomegaly, and bowel sounds hypoactive.   Rectal:  no external abnormalities, normal sphincter tone, no masses, no tenderness, no fissures, no fistulae, no perianal rash, and external hemorrhoid(s).   Msk:  she has decreased ROM in her right knee and gait is antalgic Pulses:  R and L carotid,radial,femoral,dorsalis pedis and posterior tibial pulses are full and equal bilaterally Extremities:  No clubbing, cyanosis, edema, or deformity noted with normal full range of motion of all joints.   Neurologic:  No cranial nerve deficits noted. Station and gait are normal. Plantar reflexes are down-going bilaterally. DTRs are symmetrical throughout. Sensory, motor and coordinative functions appear intact. Skin:  Intact without suspicious lesions or rashes Cervical Nodes:  no anterior cervical adenopathy and no posterior cervical adenopathy.   Axillary Nodes:  no R axillary adenopathy and no L axillary adenopathy.   Inguinal Nodes:  no R inguinal adenopathy and no L inguinal adenopathy.   Psych:  Cognition and judgment appear intact. Alert and cooperative with normal attention span and concentration. No apparent delusions, illusions, hallucinations   Impression & Recommendations:  Problem # 1:  CHEST PAIN (ICD-786.50) Assessment New her ekg shows low voltage but no evidence of ischemia or infarct, will check labs to look for other causes of pain Orders: Venipuncture (16109) TLB-BMP (Basic Metabolic Panel-BMET) (80048-METABOL) TLB-CBC Platelet - w/Differential (85025-CBCD) TLB-Hepatic/Liver Function Pnl  (80076-HEPATIC) TLB-TSH (Thyroid Stimulating Hormone) (84443-TSH) TLB-Amylase (82150-AMYL) TLB-CRP-High Sensitivity (C-Reactive Protein) (86140-FCRP) TLB-Lipase (83690-LIPASE) TLB-CK Total Only(Creatine Kinase/CPK) (82550-CK) TLB-BNP (B-Natriuretic Peptide) (83880-BNPR) T-D-Dimer Fibrin Derivatives Quantitive (60454-09811) TLB-Udip w/ Micro (81001-URINE) EKG w/ Interpretation (93000)  Problem # 2:  ABDOMINAL PAIN, EPIGASTRIC (ICD-789.06) Assessment: New will screen for inflammatory disease, etc Orders: Venipuncture (91478) TLB-BMP (Basic Metabolic Panel-BMET) (80048-METABOL) TLB-CBC Platelet - w/Differential (85025-CBCD) TLB-Hepatic/Liver Function Pnl (80076-HEPATIC) TLB-TSH (Thyroid Stimulating Hormone) (84443-TSH) TLB-Amylase (82150-AMYL) TLB-CRP-High Sensitivity (C-Reactive Protein) (86140-FCRP) TLB-Lipase (83690-LIPASE) TLB-CK Total Only(Creatine Kinase/CPK) (82550-CK) TLB-BNP (B-Natriuretic Peptide) (83880-BNPR) T-D-Dimer Fibrin Derivatives Quantitive (29562-13086) TLB-Udip w/ Micro (81001-URINE) DRE (G0102) Hemoccult Guaiac-1 spec.(in office) (82270)  Problem # 3:  DIARRHEA (ICD-787.91) Assessment: New  Orders: T-C diff by PCR (57846) Venipuncture (96295) TLB-BMP (Basic Metabolic Panel-BMET) (80048-METABOL) TLB-CBC Platelet - w/Differential (85025-CBCD)  TLB-Hepatic/Liver Function Pnl (80076-HEPATIC) TLB-TSH (Thyroid Stimulating Hormone) (84443-TSH) TLB-Amylase (82150-AMYL) TLB-CRP-High Sensitivity (C-Reactive Protein) (86140-FCRP) TLB-Lipase (83690-LIPASE) TLB-CK Total Only(Creatine Kinase/CPK) (82550-CK) TLB-BNP (B-Natriuretic Peptide) (83880-BNPR) T-D-Dimer Fibrin Derivatives Quantitive (60454-09811) TLB-Udip w/ Micro (81001-URINE) DRE (G0102) Hemoccult Guaiac-1 spec.(in office) (82270)  Problem # 4:  COUGH (ICD-786.2) Assessment: New will check for pna, edema, mass, etc. Orders: T-2 View CXR (71020TC) Venipuncture (91478) TLB-BMP (Basic Metabolic  Panel-BMET) (80048-METABOL) TLB-CBC Platelet - w/Differential (85025-CBCD) TLB-Hepatic/Liver Function Pnl (80076-HEPATIC) TLB-TSH (Thyroid Stimulating Hormone) (84443-TSH) TLB-Amylase (82150-AMYL) TLB-CRP-High Sensitivity (C-Reactive Protein) (86140-FCRP) TLB-Lipase (83690-LIPASE) TLB-CK Total Only(Creatine Kinase/CPK) (82550-CK) TLB-BNP (B-Natriuretic Peptide) (83880-BNPR) T-D-Dimer Fibrin Derivatives Quantitive (29562-13086) TLB-Udip w/ Micro (81001-URINE)  Problem # 5:  SINUSITIS- ACUTE-NOS (ICD-461.9) Assessment: Unchanged  The following medications were removed from the medication list:    Mytussin Ac 100-10 Mg/31ml Syrp (Guaifenesin-codeine) .Marland Kitchen... 5-10 ml by mouth qid as needed for cough Her updated medication list for this problem includes:    Cefuroxime Axetil 500 Mg Tabs (Cefuroxime axetil) .Marland Kitchen... Take 1 tablet by mouth two times a day  Complete Medication List: 1)  Ambien 10 Mg Tabs (Zolpidem tartrate) .Marland Kitchen.. 1 at bedtime 2)  Tramadol Hcl 50 Mg Tabs (Tramadol hcl) .... Every 4-6 hours as needed 3)  Diclofenac Sodium 75 Mg Tbec (Diclofenac sodium) .Marland Kitchen.. 1 two times a day as needed 4)  Fish Oil  5)  Calcium  6)  Contact  .Marland Kitchen.. 2tabs qid 7)  Magnesium  8)  Vitamin C  9)  Glucosamine-chondroitin  .... Take 1 tablet by mouth two times a day 10)  Cefuroxime Axetil 500 Mg Tabs (Cefuroxime axetil) .... Take 1 tablet by mouth two times a day 11)  Promethazine Hcl 25 Mg Tabs (Promethazine hcl) .... 1/2-1 by mouth qid as needed for nausea  Colorectal Screening:  Current Recommendations:    Hemoccult: NEG X 1 today  Mammogram Screening:    Last Mammogram:  01/05/2010  Osteoporosis Risk Assessment:  Risk Factors for Fracture or Low Bone Density:   Race (White or Asian):     yes   Smoking status:       never  Immunization & Chemoprophylaxis:    Influenza vaccine: Fluvax 3+  (04/05/2010)  Patient Instructions: 1)  Please schedule a follow-up appointment in 2 weeks. 2)   Complete your Hemocult cards and return them soon. 3)  Avoid foods high in acid (tomatoes, citrus juices, spicy foods). Avoid eating within two hours of lying down or before exercising. Do not over eat; try smaller more frequent meals. Elevate head of bed twelve inches when sleeping. 4)  Acute bronchitis symptoms for less than 10 days are not helped by antibiotics. take over the counter cough medications. call if no improvment in  5-7 days, sooner if increasing cough, fever, or new symptoms( shortness of breath, chest pain). Prescriptions: PROMETHAZINE HCL 25 MG TABS (PROMETHAZINE HCL) 1/2-1 by mouth QID as needed for nausea  #25 x 0   Entered and Authorized by:   Etta Grandchild MD   Signed by:   Etta Grandchild MD on 05/11/2010   Method used:   Print then Give to Patient   RxID:   [id number]    Orders Added: 1)  T-2 View CXR [71020TC] 2)  T-C diff by PCR [81755] 3)  Venipuncture [44010] 4)  TLB-BMP (Basic Metabolic Panel-BMET) [80048-METABOL] 5)  TLB-CBC Platelet - w/Differential [85025-CBCD] 6)  TLB-Hepatic/Liver Function Pnl [80076-HEPATIC] 7)  TLB-TSH (Thyroid Stimulating Hormone) [84443-TSH] 8)  TLB-Amylase [82150-AMYL] 9)  TLB-CRP-High Sensitivity (  C-Reactive Protein) [86140-FCRP] 10)  TLB-Lipase [83690-LIPASE] 11)  TLB-CK Total Only(Creatine Kinase/CPK) [82550-CK] 12)  TLB-BNP (B-Natriuretic Peptide) [83880-BNPR] 13)  T-D-Dimer Fibrin Derivatives Quantitive [16109-60454] 14)  TLB-Udip w/ Micro [81001-URINE] 15)  DRE [G0102] 16)  Hemoccult Guaiac-1 spec.(in office) [82270] 17)  EKG w/ Interpretation [93000] 18)  Est. Patient Level IV [09811]

## 2010-06-23 NOTE — Progress Notes (Signed)
Summary: Rx replacement  Phone Note Call from Patient Call back at Home Phone [phone]   Caller: Patient RM [id number] Summary of Call: Pt called stating she misplaced Rx given at last OV -  Promethazine. Pt is requesting replacement Rx, ok to replace to preferred pharmacy? Initial call taken by: Margaret Pyle, CMA,  May 17, 2010 9:37 AM  Follow-up for Phone Call        yes Follow-up by: Etta Grandchild MD,  May 17, 2010 9:51 AM  Additional Follow-up for Phone Call Additional follow up Details #1::        left mess to call office back, need name of pharmacy Additional Follow-up by: Lamar Sprinkles, CMA,  May 17, 2010 12:19 PM    Additional Follow-up for Phone Call Additional follow up Details #2::    Pt informed  Follow-up by: Lamar Sprinkles, CMA,  May 17, 2010 1:28 PM  Prescriptions: PROMETHAZINE HCL 25 MG TABS (PROMETHAZINE HCL) 1/2-1 by mouth QID as needed for nausea  #25 x 0   Entered by:   Lamar Sprinkles, CMA   Authorized by:   Etta Grandchild MD   Signed by:   Lamar Sprinkles, CMA on 05/17/2010   Method used:   Electronically to        [facility]. [phone]* (retail)       [address]       [place], [place]  [address]       Ph: [phone]       Fax: [phone]   RxID:   [id number]

## 2010-07-11 ENCOUNTER — Telehealth: Payer: Self-pay | Admitting: Internal Medicine

## 2010-07-19 NOTE — Progress Notes (Signed)
Summary: Cindy Clarke refill  Phone Note Refill Request Message from:  Fax from Pharmacy on July 11, 2010 11:57 AM  Refills Requested: Medication #1:  AMBIEN 10 MG TABS 1 at bedtime   Dosage confirmed as above?Dosage Confirmed   Supply Requested: 1 month  IS this ok to refill?  Initial call taken by: Rock Nephew CMA,  July 11, 2010 11:58 AM  Follow-up for Phone Call        yes Follow-up by: Etta Grandchild MD,  July 11, 2010 12:03 PM    Prescriptions: AMBIEN 10 MG TABS (ZOLPIDEM TARTRATE) 1 at bedtime  #30 x 5   Entered by:   Rock Nephew CMA   Authorized by:   Etta Grandchild MD   Signed by:   Rock Nephew CMA on 07/11/2010   Method used:   Telephoned to ...       [facility] [place] [phone]* (retail)       [phone]       [place], [place]  [address]       Ph: [phone] or [phone]       Fax: [phone]   RxID:   [id number]  Rx faxed to [phone] [facility] [place]

## 2010-09-15 ENCOUNTER — Ambulatory Visit (INDEPENDENT_AMBULATORY_CARE_PROVIDER_SITE_OTHER): Payer: Medicare PPO | Admitting: Internal Medicine

## 2010-09-15 ENCOUNTER — Encounter: Payer: Self-pay | Admitting: Internal Medicine

## 2010-09-15 ENCOUNTER — Telehealth: Payer: Self-pay

## 2010-09-15 DIAGNOSIS — E785 Hyperlipidemia, unspecified: Secondary | ICD-10-CM

## 2010-09-15 DIAGNOSIS — M1990 Unspecified osteoarthritis, unspecified site: Secondary | ICD-10-CM

## 2010-09-15 DIAGNOSIS — G4700 Insomnia, unspecified: Secondary | ICD-10-CM

## 2010-09-15 MED ORDER — DICLOFENAC SODIUM 75 MG PO TBEC: 75.0000 mg | DELAYED_RELEASE_TABLET | Freq: Two times a day (BID) | ORAL | Status: DC

## 2010-09-15 MED ORDER — ZOLPIDEM TARTRATE 10 MG PO TABS: 10.0000 mg | ORAL_TABLET | Freq: Every evening | ORAL | Status: DC | PRN

## 2010-09-15 MED ORDER — TRAMADOL HCL 50 MG PO TABS: 50.0000 mg | ORAL_TABLET | Freq: Four times a day (QID) | ORAL | Status: DC | PRN

## 2010-09-15 NOTE — Assessment & Plan Note (Signed)
She is getting good relief with [place]

## 2010-09-15 NOTE — Assessment & Plan Note (Signed)
Continue current meds 

## 2010-09-15 NOTE — Progress Notes (Signed)
Subjective:    Patient ID: Cindy Clarke, female    DOB: January 22, 1931, 77 y.o.   MRN: [id number]  Arthritis Presents for follow-up visit. She complains of pain. She reports no stiffness, joint swelling or joint warmth. The symptoms have been stable. Affected locations include the right shoulder, left shoulder, right knee and left knee. Her pain is at a severity of 2/10. Pertinent negatives include no diarrhea, dry eyes, dry mouth, dysuria, fatigue, fever, pain at night, pain while resting, rash, Raynaud's syndrome, uveitis or weight loss. Compliance with total regimen is 51-75%. Compliance problems include medication cost.  Compliance with medications is 51-75%.      Review of Systems  Constitutional: Negative for fever, chills, weight loss, diaphoresis, activity change, appetite change, fatigue and unexpected weight change.  HENT: Negative for facial swelling, neck pain and neck stiffness.   Respiratory: Negative for apnea, cough, choking, chest tightness, shortness of breath, wheezing and stridor.   Cardiovascular: Negative for chest pain, palpitations and leg swelling.  Gastrointestinal: Negative for nausea, vomiting, abdominal pain, diarrhea, constipation, blood in stool, abdominal distention, anal bleeding and rectal pain.  Genitourinary: Negative for dysuria, urgency, frequency, hematuria, flank pain, decreased urine volume, enuresis and difficulty urinating.  Musculoskeletal: Positive for arthralgias and arthritis. Negative for myalgias, back pain, joint swelling, gait problem and stiffness.  Skin: Negative for color change, pallor and rash.  Neurological: Negative for dizziness, tremors, seizures, syncope, facial asymmetry, speech difficulty, weakness, light-headedness, numbness and headaches.  Hematological: Negative for adenopathy. Does not bruise/bleed easily.  Psychiatric/Behavioral: Positive for sleep disturbance. Negative for suicidal ideas, hallucinations, behavioral problems,  confusion, self-injury, dysphoric mood, decreased concentration and agitation. The patient is not nervous/anxious and is not hyperactive.        Objective:   Physical Exam  [vitalsreviewed. Constitutional: She is oriented to person, place, and time. She appears well-developed and well-nourished. No distress.  HENT:  Head: Normocephalic and atraumatic.  Right Ear: External ear normal.  Left Ear: External ear normal.  Nose: Nose normal.  Mouth/Throat: Oropharynx is clear and moist. No oropharyngeal exudate.  Eyes: Conjunctivae and EOM are normal. Pupils are equal, round, and reactive to light. Right eye exhibits no discharge. Left eye exhibits no discharge. No scleral icterus.  Neck: Normal range of motion. Neck supple. No JVD present. No tracheal deviation present. No thyromegaly present.  Cardiovascular: Normal rate, regular rhythm, normal heart sounds and intact distal pulses.  Exam reveals no gallop and no friction rub.   No murmur heard. Pulmonary/Chest: Breath sounds normal. No stridor. No respiratory distress. She has no wheezes. She has no rales. She exhibits no tenderness.  Abdominal: Soft. Bowel sounds are normal. She exhibits no distension and no mass. There is no tenderness. There is no rebound and no guarding.  Musculoskeletal: Normal range of motion. She exhibits no edema and no tenderness.  Lymphadenopathy:    She has no cervical adenopathy.  Neurological: She is alert and oriented to person, place, and time. She has normal reflexes. She displays normal reflexes. No cranial nerve deficit. She exhibits normal muscle tone. Coordination normal.  Skin: Skin is warm and dry. No rash noted. She is not diaphoretic. No erythema. No pallor.  Psychiatric: She has a normal mood and affect. Her behavior is normal. Judgment and thought content normal.        Lab Results  Component Value Date   WBC 7.7 05/11/2010   HGB 13.2 05/11/2010   HCT 38.3 05/11/2010   PLT 316.0 05/11/2010  CHOL 232* 12/30/2009   TRIG 153.0* 12/30/2009   HDL 45.80 12/30/2009   LDLDIRECT 163.1 12/30/2009   ALT 25 05/11/2010   AST 25 05/11/2010   NA 141 05/11/2010   K 5.3* 05/11/2010   CL 104 05/11/2010   CREATININE 1.1 05/11/2010   BUN 33* 05/11/2010   CO2 30 05/11/2010   TSH 1.58 05/11/2010    Assessment & Plan:

## 2010-09-15 NOTE — Patient Instructions (Signed)
Insomnia Insomnia is frequent trouble falling and/or staying asleep. Insomnia can be a long term problem or a short term problem. Both are common. Insomnia can be a short term problem when the wakefulness is related to a certain stress or worry. Long term insomnia is often related to ongoing stress during waking hours and/or poor sleeping habits. Overtime, sleep deprivation itself can make the problem worse. Every little thing feels more severe because you are overtired and your ability to cope is decreased. SYMPTOMS  Not feeling rested in the morning.   Anxiety and restlessness at bedtime.   Difficulty falling and staying asleep.  CAUSES  Stress, anxiety, and depression.   Poor sleeping habits.   Distractions such as TV in the bedroom.   Naps close to bedtime.   Engaging in emotionally charged conversations before bed.   Technical reading before sleep.   Alcohol and other sedatives. They may make the problem worse. They can hurt normal sleep patterns and normal dream activity.   Stimulants such as caffeine for several hours prior to bedtime.   Pain syndromes and shortness of breath can cause insomnia.   Exercise late at night.   Changing time zones may cause sleeping problems (jet lag).  It is sometimes helpful to have someone observe your sleeping patterns. They should look for periods of not breathing during the night (sleep apnea). They should also look to see how long those periods last. If you live alone or observers are uncertain, you can also be observed at a sleep clinic where your sleep patterns will be professionally monitored. Sleep apnea requires a checkup and treatment. Give your caregivers your medical history. Give your caregivers observations your family has made about your sleep.  TREATMENT  Your caregiver may prescribe treatment for an underlying medical disorders. Your caregiver can give advice or help if you are using alcohol or other drugs for self-medication.  Treatment of underlying problems will usually eliminate insomnia problems.   Medications can be prescribed for short time use. They are generally not recommended for lengthy use.   Over-the-counter sleep medicines are not recommended for lengthy use. They can be habit forming.   You can promote easier sleeping by making lifestyle changes such as:   Using relaxation techniques that help with breathing and reduce muscle tension.   Exercising earlier in the day.   Changing your diet and the time of your last meal. No night time snacks.   Establish a regular time to go to bed.   Counseling can help with stressful problems and worry.   Soothing music and white noise may be helpful if there are background noises you cannot remove.   Stop tedious detailed work at least one hour before bedtime.  HOME CARE INSTRUCTIONS  Keep a diary. Inform your caregiver about your progress. This includes any medication side effects. See your caregiver regularly. Take note of:   Times when you are asleep.  Times when you are awake during the night.   The quality of your sleep.  How you feel the next day.   This information will help your caregiver care for you.  Get out of bed if you are still awake after 15 minutes. Read or do some quiet activity. Keep the lights down. Wait until you feel sleepy and go back to bed.   Keep regular sleeping and waking hours. Avoid naps.   Exercise regularly.   Avoid distractions at bedtime. Distractions include watching television or engaging in any intense or detailed  activity like attempting to balance the household checkbook.   Develop a bedtime ritual. Keep a familiar routine of bathing, brushing your teeth, climbing into bed at the same time each night, listening to soothing music. Routines increase the success of falling to sleep faster.   Use relaxation techniques. This can be using breathing and muscle tension release routines. It can also include  visualizing peaceful scenes. You can also help control troubling or intruding thoughts by keeping your mind occupied with boring or repetitive thoughts like the old concept of counting sheep. You can make it more creative like imagining planting one beautiful flower after another in your backyard garden.   During your day, work to eliminate stress. When this is not possible use some of the previous suggestions to help reduce the anxiety that accompanies stressful situations.  MAKE SURE YOU:   Understand these instructions.   Will watch your condition.   Will get help right away if you are not doing well or get worse.  Document Released: 05/05/2000 Document Re-Released: 04/20/2008 [facility] Patient Information 2011 [place], [place].Arthritis - Degenerative, Osteoarthritis You have osteoarthritis. This is the wear and tear arthritis that comes with aging. It is also called degenerative arthritis. This is common in people past middle age. It is caused by stress on the joints from living. The large weight bearing joints of the lower extremities are most often affected. The knees, hips, back, neck, and hands can become painful, swollen, and stiff. This is the most common type of arthritis. It comes on with age, carrying too much weight, and from injury. Treatment includes resting the sore joint until the pain and swelling improve. Crutches or a walker may be needed for severe flares. Only take over-the-counter or prescription medicines for pain, discomfort, or fever as directed by your caregiver. Local heat therapy may improve motion. Cortisone shots into the joint are sometimes used to reduce pain and swelling during flares. Osteoarthritis is usually not crippling and progresses slowly. There are things you can do to decrease pain:  Avoid high impact activities.   Exercise regularly.   Low impact exercises such as walking, biking and swimming help to keep the muscles strong and keep normal joint  function.   Stretching helps to keep your range of motion.   Lose weight if you are overweight. This reduces joint stress.  In severe cases when you have pain at rest or increasing disability, joint surgery may be helpful. See your caregiver for follow-up treatment as recommended.  SEEK IMMEDIATE MEDICAL CARE IF:  You have severe joint pain.   Marked swelling and redness in your joint develops.   You develop a high fever.  Document Released: 05/08/2005 Document Re-Released: 09/07/2007 [facility] Patient Information 2011 [place], [place].

## 2010-09-15 NOTE — Assessment & Plan Note (Signed)
She does not want to treat this 

## 2010-09-15 NOTE — Telephone Encounter (Signed)
error 

## 2010-10-05 ENCOUNTER — Emergency Department (HOSPITAL_COMMUNITY): Payer: Medicare PPO

## 2010-10-05 ENCOUNTER — Emergency Department (HOSPITAL_COMMUNITY)
Admission: EM | Admit: 2010-10-05 | Discharge: 2010-10-05 | Disposition: A | Discharge status: Home / Self Care | Payer: Medicare PPO | Attending: Emergency Medicine | Admitting: Emergency Medicine

## 2010-10-05 DIAGNOSIS — S335XXA Sprain of ligaments of lumbar spine, initial encounter: Secondary | ICD-10-CM | POA: Insufficient documentation

## 2010-10-05 DIAGNOSIS — M549 Dorsalgia, unspecified: Secondary | ICD-10-CM | POA: Insufficient documentation

## 2010-10-05 DIAGNOSIS — M810 Age-related osteoporosis without current pathological fracture: Secondary | ICD-10-CM | POA: Insufficient documentation

## 2010-10-05 DIAGNOSIS — S22009A Unspecified fracture of unspecified thoracic vertebra, initial encounter for closed fracture: Secondary | ICD-10-CM | POA: Insufficient documentation

## 2010-10-05 DIAGNOSIS — M129 Arthropathy, unspecified: Secondary | ICD-10-CM | POA: Insufficient documentation

## 2010-10-05 DIAGNOSIS — Y998 Other external cause status: Secondary | ICD-10-CM | POA: Insufficient documentation

## 2010-10-05 DIAGNOSIS — Y92009 Unspecified place in unspecified non-institutional (private) residence as the place of occurrence of the external cause: Secondary | ICD-10-CM | POA: Insufficient documentation

## 2010-10-05 DIAGNOSIS — E7800 Pure hypercholesterolemia, unspecified: Secondary | ICD-10-CM | POA: Insufficient documentation

## 2010-10-05 DIAGNOSIS — W06XXXA Fall from bed, initial encounter: Secondary | ICD-10-CM | POA: Insufficient documentation

## 2010-10-05 LAB — URINALYSIS, ROUTINE W REFLEX MICROSCOPIC
Bilirubin Urine: NEGATIVE
Nitrite: NEGATIVE
Specific Gravity, Urine: 1.028 (ref 1.005–1.030)
pH: 5 (ref 5.0–8.0)

## 2010-10-05 LAB — POCT I-STAT, CHEM 8
Chloride: 108 mEq/L (ref 96–112)
Creatinine, Ser: 1.2 mg/dL (ref 0.4–1.2)
Glucose, Bld: 113 mg/dL — ABNORMAL HIGH (ref 70–99)
HCT: 40 % (ref 36.0–46.0)
Potassium: 3.6 mEq/L (ref 3.5–5.1)
Sodium: 141 mEq/L (ref 135–145)

## 2010-10-05 LAB — URINE MICROSCOPIC-ADD ON

## 2010-10-06 ENCOUNTER — Emergency Department (HOSPITAL_COMMUNITY)
Admission: EM | Admit: 2010-10-06 | Discharge: 2010-10-06 | Disposition: A | Discharge status: Home / Self Care | Payer: Medicare PPO | Attending: Emergency Medicine | Admitting: Emergency Medicine

## 2010-10-06 ENCOUNTER — Emergency Department (HOSPITAL_COMMUNITY)
Admission: EM | Admit: 2010-10-06 | Discharge: 2010-10-06 | Discharge status: Against Medical Advice | Payer: Medicare PPO | Attending: Emergency Medicine | Admitting: Emergency Medicine

## 2010-10-06 DIAGNOSIS — M545 Low back pain, unspecified: Secondary | ICD-10-CM | POA: Insufficient documentation

## 2010-10-06 DIAGNOSIS — Y92009 Unspecified place in unspecified non-institutional (private) residence as the place of occurrence of the external cause: Secondary | ICD-10-CM | POA: Insufficient documentation

## 2010-10-06 DIAGNOSIS — M129 Arthropathy, unspecified: Secondary | ICD-10-CM | POA: Insufficient documentation

## 2010-10-06 DIAGNOSIS — E7800 Pure hypercholesterolemia, unspecified: Secondary | ICD-10-CM | POA: Insufficient documentation

## 2010-10-06 DIAGNOSIS — M810 Age-related osteoporosis without current pathological fracture: Secondary | ICD-10-CM | POA: Insufficient documentation

## 2010-10-06 DIAGNOSIS — Z79899 Other long term (current) drug therapy: Secondary | ICD-10-CM | POA: Insufficient documentation

## 2010-10-06 DIAGNOSIS — M1990 Unspecified osteoarthritis, unspecified site: Secondary | ICD-10-CM | POA: Insufficient documentation

## 2010-10-06 DIAGNOSIS — IMO0002 Reserved for concepts with insufficient information to code with codable children: Secondary | ICD-10-CM | POA: Insufficient documentation

## 2010-10-06 DIAGNOSIS — W06XXXA Fall from bed, initial encounter: Secondary | ICD-10-CM | POA: Insufficient documentation

## 2010-10-06 DIAGNOSIS — K5900 Constipation, unspecified: Secondary | ICD-10-CM | POA: Insufficient documentation

## 2010-10-06 DIAGNOSIS — R339 Retention of urine, unspecified: Secondary | ICD-10-CM | POA: Insufficient documentation

## 2010-10-06 DIAGNOSIS — R32 Unspecified urinary incontinence: Secondary | ICD-10-CM | POA: Insufficient documentation

## 2010-10-06 LAB — URINE CULTURE
Colony Count: 35000
Culture  Setup Time: 201205161112

## 2010-10-11 ENCOUNTER — Encounter: Payer: Self-pay | Admitting: Internal Medicine

## 2010-10-11 DIAGNOSIS — Z0000 Encounter for general adult medical examination without abnormal findings: Secondary | ICD-10-CM | POA: Insufficient documentation

## 2010-10-12 ENCOUNTER — Ambulatory Visit (INDEPENDENT_AMBULATORY_CARE_PROVIDER_SITE_OTHER): Payer: Medicare PPO | Admitting: Internal Medicine

## 2010-10-12 ENCOUNTER — Encounter: Payer: Self-pay | Admitting: Internal Medicine

## 2010-10-12 VITALS — BP 154/88 | HR 84 | Temp 97.6°F

## 2010-10-12 DIAGNOSIS — IMO0002 Reserved for concepts with insufficient information to code with codable children: Secondary | ICD-10-CM

## 2010-10-12 DIAGNOSIS — F329 Major depressive disorder, single episode, unspecified: Secondary | ICD-10-CM

## 2010-10-12 DIAGNOSIS — F341 Dysthymic disorder: Secondary | ICD-10-CM

## 2010-10-12 DIAGNOSIS — M5416 Radiculopathy, lumbar region: Secondary | ICD-10-CM | POA: Insufficient documentation

## 2010-10-12 DIAGNOSIS — F419 Anxiety disorder, unspecified: Secondary | ICD-10-CM

## 2010-10-12 DIAGNOSIS — K5900 Constipation, unspecified: Secondary | ICD-10-CM | POA: Insufficient documentation

## 2010-10-12 DIAGNOSIS — F32A Depression, unspecified: Secondary | ICD-10-CM

## 2010-10-12 HISTORY — DX: Depression, unspecified: F32.A

## 2010-10-12 HISTORY — DX: Anxiety disorder, unspecified: F41.9

## 2010-10-12 MED ORDER — CYCLOBENZAPRINE HCL 5 MG PO TABS: 5.0000 mg | ORAL_TABLET | Freq: Three times a day (TID) | ORAL | Status: AC | PRN

## 2010-10-12 MED ORDER — OXYCODONE HCL 5 MG PO TABS
5.0000 mg | ORAL_TABLET | Freq: Three times a day (TID) | ORAL | Status: DC | PRN
Start: 2010-10-12 — End: 2010-10-12

## 2010-10-12 MED ORDER — PREDNISONE 10 MG PO TABS: 10.0000 mg | ORAL_TABLET | Freq: Every day | ORAL | Status: AC

## 2010-10-12 MED ORDER — OXYCODONE HCL 5 MG PO TABS: 5.0000 mg | ORAL_TABLET | Freq: Four times a day (QID) | ORAL | Status: DC | PRN

## 2010-10-12 NOTE — Progress Notes (Signed)
Subjective:    Patient ID: Cindy Clarke, female    DOB: 11-Jun-1930, 77 y.o.   MRN: [id number]  HPI Pt of Dr Yetta Barre who is unavailable today;  Pt states simply fell out of bed may 14 and struck a hardobject (birdcage), seen in ER (twice actually) with back pain, films neg for rx, tx with percocet per pt which worked well, but then asked to take tramadol which has not worked well and in fact "everything all over" seems to hurt more as well.  Pain severe , all across the lower back, no  bladder change but has had some costipation - ? Due to pain meds, no fever or wt loss,  or worsening LE pain/numbness/weakness except for pain down the post right leg to just below the right knee level, but hard for her to distinguish between her right knee pain she has as well recurrent , but no gait change or falls. Has had worsening depressive symptoms and anxiety due to mult social stressors, but no suicidal ideation, or panic.  Currently living in [place], but plans to get out of there soon. Tearful and stressed today Past Medical History  Diagnosis Date  . Hyperlipidemia   . Osteoarthritis   . Osteoporosis    Past Surgical History  Procedure Date  . Abdominal hysterectomy     reports that she has never smoked. She does not have any smokeless tobacco history on file. She reports that she does not drink alcohol or use illicit drugs. family history includes Arthritis in her other and Hyperlipidemia in her other. Allergies  Allergen Reactions  . Niacin   . Statins    Current Outpatient Prescriptions on File Prior to Visit  Medication Sig Dispense Refill  . Ascorbic Acid (VITAMIN C) 250 MG tablet Take 250 mg by mouth daily.        . diclofenac (VOLTAREN) 75 MG EC tablet Take 1 tablet (75 mg total) by mouth 2 (two) times daily.  180 tablet  3  . Glucosamine-Chondroitin-MSM TABS Take 1 tablet by mouth 2 (two) times daily.        . traMADol (ULTRAM) 50 MG tablet Take 1 tablet (50 mg total) by mouth 4 (four)  times daily as needed for pain. Every 4-6 hours as needed  120 tablet  11  . promethazine (PHENERGAN) 25 MG tablet Take 25 mg by mouth every 6 (six) hours as needed.        . zolpidem (AMBIEN) 10 MG tablet Take 1 tablet (10 mg total) by mouth at bedtime as needed.  30 tablet  5   Review of Systems All otherwise neg per pt     Objective:   Physical Exam BP 154/88  Pulse 84  Temp(Src) 97.6 F (36.4 C) (Oral)  SpO2 96% Physical Exam  VS noted Constitutional: Pt appears well-developed and well-nourished.  HENT: Head: Normocephalic.  Right Ear: External ear normal.  Left Ear: External ear normal.  Eyes: Conjunctivae and EOM are normal. Pupils are equal, round, and reactive to light.  Neck: Normal range of motion. Neck supple.  Cardiovascular: Normal rate and regular rhythm.   Pulmonary/Chest: Effort normal and breath sounds normal.  Abd:  Soft, NT, non-distended, + BS Neurological: Pt is alert. No cranial nerve deficit. motor/dtr's intact, but ? Decreased sens to LT to l5 and s1 on the right; Gait stiff and slow to stand and walk, has to turn to the side to sit up from lying down and cries out with pain; Spine  o/w nontender but has marked right paraspinal msk spasm as well Skin: Skin is warm. No erythema.  Psychiatric: Pt behavior is normal. Thought content normal. but 2+ nervous, tearful, somewhat depressed affect       Assessment & Plan:

## 2010-10-12 NOTE — Assessment & Plan Note (Signed)
Acute situational it seems, declines tx, will need to focus on back situation, pt instructed to call if any worsening symptoms, verified nonsuicidal today

## 2010-10-12 NOTE — Assessment & Plan Note (Signed)
Mild, for OTC miralax daily especially while on narcotic, and prn mag citrate x 1, MOM prn

## 2010-10-12 NOTE — Assessment & Plan Note (Signed)
Severe pain to lower back with radicular aspect, with some evidence for decreased sensation to RLE o/w neuro intact, also with marked right paraspinal MSK spasm as well - for pain control, muscle relaxer and predpack, f/u Dr Yetta Barre in 2 wk, may need MRI lumbar

## 2010-10-12 NOTE — Patient Instructions (Addendum)
Take all new medications as prescribed - the pain medication, muscle relaxer, and prednisone Please do not take the voltaren for now You should also take Miralax 17 gm in 8 oz water daily for constipation, and can even consider MOM as needed, or even a one time bottle of Magnesium Citrate Please return in 2 weeks to see Dr Yetta Barre, as if you are not improved, you may need MRI of the lower back

## 2010-10-20 ENCOUNTER — Telehealth: Payer: Self-pay

## 2010-10-20 ENCOUNTER — Other Ambulatory Visit: Payer: Self-pay | Admitting: Internal Medicine

## 2010-10-20 DIAGNOSIS — M5416 Radiculopathy, lumbar region: Secondary | ICD-10-CM

## 2010-10-20 MED ORDER — OXYCODONE-ACETAMINOPHEN 10-325 MG PO TABS: 1.0000 | ORAL_TABLET | Freq: Four times a day (QID) | ORAL | Status: DC | PRN

## 2010-10-20 NOTE — Telephone Encounter (Signed)
Patient notified to pick up rx/rx placed upfront

## 2010-10-26 ENCOUNTER — Encounter: Payer: Self-pay | Admitting: Internal Medicine

## 2010-10-26 ENCOUNTER — Telehealth: Payer: Self-pay

## 2010-10-26 ENCOUNTER — Ambulatory Visit (INDEPENDENT_AMBULATORY_CARE_PROVIDER_SITE_OTHER): Payer: TRICARE For Life (TFL) | Admitting: Internal Medicine

## 2010-10-26 VITALS — BP 148/84 | HR 84 | Temp 98.0°F | Resp 16

## 2010-10-26 DIAGNOSIS — M810 Age-related osteoporosis without current pathological fracture: Secondary | ICD-10-CM

## 2010-10-26 DIAGNOSIS — S22009A Unspecified fracture of unspecified thoracic vertebra, initial encounter for closed fracture: Secondary | ICD-10-CM

## 2010-10-26 DIAGNOSIS — M549 Dorsalgia, unspecified: Secondary | ICD-10-CM

## 2010-10-26 DIAGNOSIS — IMO0002 Reserved for concepts with insufficient information to code with codable children: Secondary | ICD-10-CM

## 2010-10-26 DIAGNOSIS — M5416 Radiculopathy, lumbar region: Secondary | ICD-10-CM

## 2010-10-26 MED ORDER — RISEDRONATE SODIUM 35 MG PO TBEC: 1.0000 | DELAYED_RELEASE_TABLET | ORAL | Status: DC

## 2010-10-26 NOTE — Assessment & Plan Note (Signed)
She will continue current meds for pain

## 2010-10-26 NOTE — Telephone Encounter (Signed)
abstract

## 2010-10-26 NOTE — Assessment & Plan Note (Addendum)
Today I will check plain films of the T/L region to see if the fracture has destabilized and I have ordered an MRI to see is there has been any compromise of the spinal cord or nerve roots

## 2010-10-26 NOTE — Assessment & Plan Note (Signed)
As above.

## 2010-10-26 NOTE — Progress Notes (Signed)
Subjective:    Patient ID: Cindy Clarke, female    DOB: [birth year], 77 y.o.   MRN: [id number]  Back Pain This is a new problem. The current episode started 1 to 4 weeks ago. The problem occurs constantly. The problem has been gradually worsening since onset. The pain is present in the lumbar spine. The quality of the pain is described as burning and shooting. The pain radiates to the right foot and left foot. The pain is at a severity of 6/10. The pain is moderate. The pain is worse during the day. The symptoms are aggravated by bending and standing. Stiffness is present all day. Associated symptoms include numbness, paresis, paresthesias and weakness. Pertinent negatives include no abdominal pain, bladder incontinence, bowel incontinence, chest pain, dysuria, fever, headaches, leg pain, pelvic pain, perianal numbness, tingling or weight loss. Risk factors include history of osteoporosis and recent trauma. She has tried analgesics and NSAIDs for the symptoms. The treatment provided mild relief.  On Oct 05, 2010 she had plain films done of the low back that showed a T12 compression deformity of indeterminate age and it was recommended that she undergo an MRI of the area to obtain more info.    Review of Systems  Constitutional: Positive for activity change (she is not walking as much due to pain and weakness in her legs). Negative for fever, chills, weight loss, diaphoresis, appetite change, fatigue and unexpected weight change.  HENT: Negative for sore throat, facial swelling, trouble swallowing, neck pain, neck stiffness and voice change.   Respiratory: Negative for cough, shortness of breath, wheezing and stridor.   Cardiovascular: Negative for chest pain, palpitations and leg swelling.  Gastrointestinal: Negative for nausea, vomiting, abdominal pain, diarrhea, constipation and bowel incontinence.  Genitourinary: Negative for bladder incontinence, dysuria and pelvic pain.  Musculoskeletal:  Positive for myalgias (pain in both legs), back pain, arthralgias and gait problem. Negative for joint swelling.  Skin: Negative for color change, pallor and rash.  Neurological: Positive for weakness, numbness and paresthesias. Negative for dizziness, tingling, tremors, seizures, syncope, facial asymmetry, speech difficulty, light-headedness and headaches.  Hematological: Negative for adenopathy. Does not bruise/bleed easily.  Psychiatric/Behavioral: Negative.        Objective:   Physical Exam  [vitalsreviewed. Constitutional: She is oriented to person, place, and time. She appears well-developed and well-nourished. No distress.  HENT:  Head: Normocephalic and atraumatic.  Right Ear: External ear normal.  Left Ear: External ear normal.  Nose: Nose normal.  Mouth/Throat: Oropharynx is clear and moist. No oropharyngeal exudate.  Eyes: Conjunctivae and EOM are normal. Pupils are equal, round, and reactive to light. Right eye exhibits no discharge. Left eye exhibits no discharge. No scleral icterus.  Neck: Normal range of motion. Neck supple. No JVD present. No tracheal deviation present. No thyromegaly present.  Cardiovascular: Normal rate, regular rhythm, normal heart sounds and intact distal pulses.  Exam reveals no gallop and no friction rub.   No murmur heard. Pulmonary/Chest: Effort normal and breath sounds normal. No stridor. No respiratory distress. She has no wheezes. She has no rales. She exhibits no tenderness.  Abdominal: Soft. Bowel sounds are normal. She exhibits no distension and no mass. There is no tenderness. There is no rebound and no guarding.  Musculoskeletal: Normal range of motion. She exhibits no edema and no tenderness.       Lumbar back: She exhibits tenderness and bony tenderness. She exhibits normal range of motion, no swelling, no edema, no deformity, no pain and no spasm.  Lymphadenopathy:    She has no cervical adenopathy.  Neurological: She is alert and  oriented to person, place, and time. She has normal strength. She displays no atrophy, no tremor and normal reflexes. No cranial nerve deficit or sensory deficit. She exhibits normal muscle tone. She displays a negative Romberg sign. She displays no seizure activity. Gait (weak legs) abnormal. Coordination normal. She displays no Babinski's sign on the right side. She displays no Babinski's sign on the left side.  Reflex Scores:      Tricep reflexes are 1+ on the right side and 1+ on the left side.      Bicep reflexes are 1+ on the right side and 1+ on the left side.      Brachioradialis reflexes are 1+ on the right side and 1+ on the left side.      Patellar reflexes are 1+ on the right side and 1+ on the left side.      Achilles reflexes are 0 on the right side and 0 on the left side. Skin: Skin is warm and dry. No rash noted. She is not diaphoretic. No erythema. No pallor.  Psychiatric: She has a normal mood and affect. Her behavior is normal. Judgment and thought content normal.          Assessment & Plan:

## 2010-10-26 NOTE — Assessment & Plan Note (Signed)
Start atelvia and get an updated DEXA scan done

## 2010-10-26 NOTE — Patient Instructions (Signed)
Back Pain (Lumbosacral Strain) Back pain is one of the most common causes of pain. There are many causes of back pain. Most are not serious conditions.  CAUSES Your backbone (spinal column) is made up of 24 main vertebral bodies, the sacrum, and the coccyx. These are held together by muscles and tough, fibrous tissue (ligaments). Nerve roots pass through the openings between the vertebrae. A sudden move or injury to the back may cause injury to, or pressure on, these nerves. This may result in localized back pain or pain movement (radiation) into the buttocks, down the leg, and into the foot. Sharp, shooting pain from the buttock down the back of the leg (sciatica) is frequently associated with a ruptured (herniated) disc. Pain may be caused by muscle spasm alone. Your caregiver can often find the cause of your pain by the details of your symptoms and an exam. In some cases, you may need tests (such as X-rays). Your caregiver will work with you to decide if any tests are needed based on your specific exam. HOME CARE INSTRUCTIONS  Avoid an underactive lifestyle. Active exercise, as directed by your caregiver, is your greatest weapon against back pain.   Avoid hard physical activities (tennis, racquetball, water-skiing) if you are not in proper physical condition for it. This may aggravate and/or create problems.   If you have a back problem, avoid sports requiring sudden body movements. Swimming and walking are generally safer activities.   Maintain good posture.   Avoid becoming overweight (obese).   Use bed rest for only the most extreme, sudden (acute) episode. Your caregiver will help you determine how much bed rest is necessary.   For acute conditions, you may put ice on the injured area.   Put ice in a plastic bag.   Place a towel between your skin and the bag.   Leave the ice on for 20 minutes at a time, every 2 hours, or as needed.   After you are improved and more active, it may  help to apply heat for 30 minutes before activities.  See your caregiver if you are having pain that lasts longer than expected. Your caregiver can advise appropriate exercises and/or therapy if needed. With conditioning, most back problems can be avoided. SEEK IMMEDIATE MEDICAL CARE IF:  You have numbness, tingling, weakness, or problems with the use of your arms or legs.   You experience severe back pain not relieved with medicines.   There is a change in bowel or bladder control.   You have increasing pain in any area of the body, including your belly (abdomen).   You notice shortness of breath, dizziness, or feel faint.   You feel sick to your stomach (nauseous), are throwing up (vomiting), or become sweaty.   You notice discoloration of your toes or legs, or your feet get very cold.   Your back pain is getting worse.   You have an oral temperature above 100.5, not controlled by medicine.  MAKE SURE YOU:   Understand these instructions.   Will watch your condition.   Will get help right away if you are not doing well or get worse.  Document Released: 02/15/2005 Document Re-Released: 08/02/2009 [facility] Patient Information 2011 [place], [place].Osteoporosis Osteoporosis is a disease of the bones that makes them weaker and prone to break (fracture). By their mid-30s, most people begin to gradually lose bone strength. If this is severe enough, osteoporosis may occur. Osteopenia is a less severe weakness of the bones, which places  you at risk for osteoporosis. It is important to identify if you have osteoporosis or osteopenia. Bone fractures from osteoporosis (especially hip and spine fractures) are a major cause of hospitalization, loss of independence, and can lead to life-threatening complications. CAUSES There are a number of causes and risk factors:  Gender. Women are at a higher risk for osteoporosis than men.   Age. Bone formation slows down with age.   Ethnicity. For  unclear reasons, white and Asian women are at higher risk for osteoporosis. Hispanic and African American women are at increased, but lesser, risk.   Family history of osteoporosis can mean that you are at a higher risk for getting it.   History of bone fractures indicates you may be at higher risk of another.   Calcium is very important for bone health and strength. Not enough calcium in your diet increases your risk for osteoporosis. Vitamin D is important for calcium metabolism. You get vitamin D from sunlight, foods, or supplements.   Physical activity. Bones get stronger with weight-bearing exercise and weaker without use.   Smoking is associated with decreased bone strength.   Medicines. Cortisone medicines, too much thyroid medicine, some cancer and seizure medicines, and others can weaken bones and cause osteoporosis.   Decreased body weight is associated with osteoporosis. The small amount of estrogen-type molecules produced in fat cells seems to protect the bones.   Menopausal decrease in the hormone estrogen can cause osteoporosis.   Low levels of the hormone testosterone can cause osteoporosis.   Some medical conditions can lead to osteoporosis (hyperthyroidism, hyperparathyroidism, B12 deficiency).  SYMPTOMS Usually, no symptoms are felt as the bones weaken. The first symptoms are generally related to bone fractures. You may have silent, tiny bone fractures, especially in your spine. This can cause height loss and forward bending of the spine (kyphosis). DIAGNOSIS You or your caregiver may suspect osteoporosis based on height loss and kyphosis. Osteoporosis or osteopenia may be identified on an X-ray done for other reasons. A bone density measurement will likely be taken. Your bones are often measured at your lower spine or your hips. Measurement is done by an X-ray called a DEXA scan, or sometimes by a computerized X-ray scan (CT or CAT scan). Other tests may be done to find the  cause of osteoporosis, such as blood tests to measure calcium and vitamin D, or to monitor treatment. TREATMENT The goal of osteoporosis treatment is to prevent fractures. This is done through medicine and home care treatments. Treatment will slow the weakening of your bones and strengthen them where possible. Measures to decrease the likelihood of falling and fracturing a bone are also important. Medicine  You may need supplements if you are not getting enough calcium, vitamin D, and vitamin B12.   If you are female and menopausal, you should discuss the option of estrogen replacement or estrogen-like medicine with your caregiver.   Medicines can be taken by mouth or injection to help build bone strength. When taken by mouth, there are important directions that you need to follow.   Calcitonin is a hormone made by the thyroid gland that can help build bone strength and decrease fracture risk in the spine. It can be taken by nasal spray or injection.   Parathyroid hormone can be injected to help build bone strength.   You will need to continue to get enough calcium intake with any of these medicines.  FALL PREVENTION  If you are unsteady on your feet, use a  cane, walker, or walk with someone's help.   Remove loose rugs or electrical cords from your home.   Keep your home well lit at night. Use glasses if you need them.   Avoid icy streets and wet or waxed floors.   Hold the railing when using stairs.   Watch out for your pets.   Install grab bars in your bathroom.   Exercise. Physical activity, especially weight-bearing exercise, helps strengthen bones. Strength and balance exercise, such as tai chi, helps prevent falls.   Alcohol and some medicines can make you more likely to fall. Discuss alcohol use with your caregiver. Ask your caregiver if any of your medicines might increase your risk for falling. Ask if safer alternatives are available.  HOME CARE INSTRUCTIONS  Try to  prevent and avoid falls.   To pick up objects, bend at the knees. Do not bend with your back.   Do not smoke. If you smoke, ask for help to stop.   Have adequate calcium and vitamin D in your diet. Talk with your caregiver about amounts.   Before exercising, ask your caregiver what exercises will be good for you.   Only take over-the-counter or prescription medicines for pain, discomfort, or fever as directed by your caregiver.  SEEK MEDICAL CARE IF:  You have had a fracture and your pain is not controlled.   You have had a fracture and you are not able to return to activities as expected.   You are reinjured.   You develop side effects from medicines, especially stomach pain or trouble swallowing.   You develop new, unexplained problems.  SEEK IMMEDIATE MEDICAL CARE IF:  You develop sudden, severe pain in your back.   You develop pain after an injury or fall.  Document Released: 02/15/2005 Document Re-Released: 10/26/2009 [facility] Patient Information 2011 [place], [place].

## 2010-11-09 ENCOUNTER — Encounter: Payer: Self-pay | Admitting: Internal Medicine

## 2010-11-09 ENCOUNTER — Ambulatory Visit (INDEPENDENT_AMBULATORY_CARE_PROVIDER_SITE_OTHER): Payer: Medicare PPO | Admitting: Internal Medicine

## 2010-11-09 ENCOUNTER — Inpatient Hospital Stay (HOSPITAL_COMMUNITY): Admission: RE | Admit: 2010-11-09 | Payer: Medicare PPO | Source: Ambulatory Visit

## 2010-11-09 VITALS — BP 138/62 | HR 80 | Temp 98.6°F | Resp 16

## 2010-11-09 DIAGNOSIS — Z23 Encounter for immunization: Secondary | ICD-10-CM

## 2010-11-09 DIAGNOSIS — M810 Age-related osteoporosis without current pathological fracture: Secondary | ICD-10-CM

## 2010-11-09 DIAGNOSIS — S22009A Unspecified fracture of unspecified thoracic vertebra, initial encounter for closed fracture: Secondary | ICD-10-CM

## 2010-11-09 DIAGNOSIS — IMO0002 Reserved for concepts with insufficient information to code with codable children: Secondary | ICD-10-CM

## 2010-11-09 DIAGNOSIS — M5416 Radiculopathy, lumbar region: Secondary | ICD-10-CM

## 2010-11-09 MED ORDER — RISEDRONATE SODIUM 35 MG PO TBEC: 1.0000 | DELAYED_RELEASE_TABLET | ORAL | Status: DC

## 2010-11-09 NOTE — Patient Instructions (Signed)
Osteoporosis Osteoporosis is a disease of the bones that makes them weaker and prone to break (fracture). By their mid-30s, most people begin to gradually lose bone strength. If this is severe enough, osteoporosis may occur. Osteopenia is a less severe weakness of the bones, which places you at risk for osteoporosis. It is important to identify if you have osteoporosis or osteopenia. Bone fractures from osteoporosis (especially hip and spine fractures) are a major cause of hospitalization, loss of independence, and can lead to life-threatening complications. CAUSES There are a number of causes and risk factors:  Gender. Women are at a higher risk for osteoporosis than men.   Age. Bone formation slows down with age.   Ethnicity. For unclear reasons, white and Asian women are at higher risk for osteoporosis. Hispanic and African American women are at increased, but lesser, risk.   Family history of osteoporosis can mean that you are at a higher risk for getting it.   History of bone fractures indicates you may be at higher risk of another.   Calcium is very important for bone health and strength. Not enough calcium in your diet increases your risk for osteoporosis. Vitamin D is important for calcium metabolism. You get vitamin D from sunlight, foods, or supplements.   Physical activity. Bones get stronger with weight-bearing exercise and weaker without use.   Smoking is associated with decreased bone strength.   Medicines. Cortisone medicines, too much thyroid medicine, some cancer and seizure medicines, and others can weaken bones and cause osteoporosis.   Decreased body weight is associated with osteoporosis. The small amount of estrogen-type molecules produced in fat cells seems to protect the bones.   Menopausal decrease in the hormone estrogen can cause osteoporosis.   Low levels of the hormone testosterone can cause osteoporosis.   Some medical conditions can lead to osteoporosis  (hyperthyroidism, hyperparathyroidism, B12 deficiency).  SYMPTOMS Usually, no symptoms are felt as the bones weaken. The first symptoms are generally related to bone fractures. You may have silent, tiny bone fractures, especially in your spine. This can cause height loss and forward bending of the spine (kyphosis). DIAGNOSIS You or your caregiver may suspect osteoporosis based on height loss and kyphosis. Osteoporosis or osteopenia may be identified on an X-ray done for other reasons. A bone density measurement will likely be taken. Your bones are often measured at your lower spine or your hips. Measurement is done by an X-ray called a DEXA scan, or sometimes by a computerized X-ray scan (CT or CAT scan). Other tests may be done to find the cause of osteoporosis, such as blood tests to measure calcium and vitamin D, or to monitor treatment. TREATMENT The goal of osteoporosis treatment is to prevent fractures. This is done through medicine and home care treatments. Treatment will slow the weakening of your bones and strengthen them where possible. Measures to decrease the likelihood of falling and fracturing a bone are also important. Medicine  You may need supplements if you are not getting enough calcium, vitamin D, and vitamin B12.   If you are female and menopausal, you should discuss the option of estrogen replacement or estrogen-like medicine with your caregiver.   Medicines can be taken by mouth or injection to help build bone strength. When taken by mouth, there are important directions that you need to follow.   Calcitonin is a hormone made by the thyroid gland that can help build bone strength and decrease fracture risk in the spine. It can be taken by   nasal spray or injection.   Parathyroid hormone can be injected to help build bone strength.   You will need to continue to get enough calcium intake with any of these medicines.  FALL PREVENTION  If you are unsteady on your feet, use a  cane, walker, or walk with someone's help.   Remove loose rugs or electrical cords from your home.   Keep your home well lit at night. Use glasses if you need them.   Avoid icy streets and wet or waxed floors.   Hold the railing when using stairs.   Watch out for your pets.   Install grab bars in your bathroom.   Exercise. Physical activity, especially weight-bearing exercise, helps strengthen bones. Strength and balance exercise, such as tai chi, helps prevent falls.   Alcohol and some medicines can make you more likely to fall. Discuss alcohol use with your caregiver. Ask your caregiver if any of your medicines might increase your risk for falling. Ask if safer alternatives are available.  HOME CARE INSTRUCTIONS  Try to prevent and avoid falls.   To pick up objects, bend at the knees. Do not bend with your back.   Do not smoke. If you smoke, ask for help to stop.   Have adequate calcium and vitamin D in your diet. Talk with your caregiver about amounts.   Before exercising, ask your caregiver what exercises will be good for you.   Only take over-the-counter or prescription medicines for pain, discomfort, or fever as directed by your caregiver.  SEEK MEDICAL CARE IF:  You have had a fracture and your pain is not controlled.   You have had a fracture and you are not able to return to activities as expected.   You are reinjured.   You develop side effects from medicines, especially stomach pain or trouble swallowing.   You develop new, unexplained problems.  SEEK IMMEDIATE MEDICAL CARE IF:  You develop sudden, severe pain in your back.   You develop pain after an injury or fall.  Document Released: 02/15/2005 Document Re-Released: 10/26/2009 [employer] Patient Information 2011 [employer]. 

## 2010-11-09 NOTE — Assessment & Plan Note (Signed)
I will not investigate this any further at her request, she is getting adequate pain relief, she will continue with atelvia and she was given pt ed material about osteoporosis

## 2010-11-09 NOTE — Progress Notes (Signed)
  Subjective:    Patient ID: Cindy Clarke, female    DOB: [birth year], 77 y.o.   MRN: [id number]  HPI She returns for f/up and she tells me that her low back pain has resolved and she does not want to do any xrays. She is getting pain relief with tramadol and she wants to keep taking atelvia.    Review of Systems  Constitutional: Negative.   HENT: Negative.   Eyes: Negative.   Respiratory: Negative.   Cardiovascular: Negative.   Gastrointestinal: Negative.   Genitourinary: Negative.   Musculoskeletal: Negative.   Skin: Negative.   Neurological: Negative.   Hematological: Negative.   Psychiatric/Behavioral: Negative.        Objective:   Physical Exam  Nursing note and vitals reviewed. Constitutional: She is oriented to person, place, and time. She appears well-developed and well-nourished. No distress.  HENT:  Head: Normocephalic and atraumatic.  Right Ear: External ear normal.  Left Ear: External ear normal.  Nose: Nose normal.  Mouth/Throat: No oropharyngeal exudate.  Eyes: Conjunctivae and EOM are normal. Pupils are equal, round, and reactive to light. Right eye exhibits no discharge. Left eye exhibits no discharge. No scleral icterus.  Neck: Normal range of motion. Neck supple. No JVD present. No tracheal deviation present. No thyromegaly present.  Cardiovascular: Normal rate, regular rhythm and intact distal pulses.  Exam reveals no gallop and no friction rub.   No murmur heard. Pulmonary/Chest: Effort normal and breath sounds normal. No stridor. No respiratory distress. She has no wheezes. She has no rales. She exhibits no tenderness.  Abdominal: Soft. Bowel sounds are normal. She exhibits no distension. There is no tenderness. There is no rebound and no guarding.  Musculoskeletal: Normal range of motion. She exhibits no edema and no tenderness.  Lymphadenopathy:    She has no cervical adenopathy.  Neurological: She is alert and oriented to person, place, and time. She  has normal reflexes. She exhibits normal muscle tone.  Skin: Skin is warm and dry. No rash noted. She is not diaphoretic. No erythema. No pallor.  Psychiatric: She has a normal mood and affect. Her behavior is normal. Judgment and thought content normal.         Lab Results  Component Value Date   WBC 7.7 05/11/2010   HGB 13.6 10/05/2010   HCT 40.0 10/05/2010   PLT 316.0 05/11/2010   CHOL 232* 12/30/2009   TRIG 153.0* 12/30/2009   HDL 45.80 12/30/2009   LDLDIRECT 163.1 12/30/2009   ALT 25 05/11/2010   AST 25 05/11/2010   NA 141 10/05/2010   K 3.6 10/05/2010   CL 108 10/05/2010   CREATININE 1.20 10/05/2010   BUN 28* 10/05/2010   CO2 30 05/11/2010   TSH 1.58 05/11/2010   Assessment & Plan:

## 2010-12-31 ENCOUNTER — Other Ambulatory Visit: Payer: Self-pay | Admitting: Internal Medicine

## 2011-03-28 ENCOUNTER — Other Ambulatory Visit: Payer: Self-pay | Admitting: *Deleted

## 2011-03-28 ENCOUNTER — Telehealth: Payer: Self-pay | Admitting: Internal Medicine

## 2011-03-28 DIAGNOSIS — G4700 Insomnia, unspecified: Secondary | ICD-10-CM

## 2011-03-28 MED ORDER — ZOLPIDEM TARTRATE 10 MG PO TABS: 10.0000 mg | ORAL_TABLET | Freq: Every evening | ORAL | Status: DC | PRN

## 2011-03-28 NOTE — Telephone Encounter (Signed)
Done hardcopy to robin  

## 2011-03-28 NOTE — Telephone Encounter (Signed)
Pt is requesting a refill on ambien 10mg  sent to [facility] on [address].  Thanks!

## 2011-03-28 NOTE — Telephone Encounter (Signed)
Patient came into office after calling [at 1:05 pm today] about being out of her Ambien. Explained to pt. That her PCP was not in the office-does not see Pts. On Tuesday afternoons. Patient chart reviewed-has seen Dr. Jonny Ruiz in past. Explained situation to Dr. Jonny Ruiz with dates of last OV & he agreed to print & sign Rx for patient's medication. Informed patient that this situation could be avoided in the future if she would call her pharmacy ahead of time or set-up automatic refills w/pharmacy.

## 2011-06-27 ENCOUNTER — Other Ambulatory Visit: Payer: Self-pay | Admitting: Internal Medicine

## 2011-08-16 ENCOUNTER — Other Ambulatory Visit: Payer: Self-pay

## 2011-08-16 DIAGNOSIS — M810 Age-related osteoporosis without current pathological fracture: Secondary | ICD-10-CM

## 2011-08-16 DIAGNOSIS — S22009A Unspecified fracture of unspecified thoracic vertebra, initial encounter for closed fracture: Secondary | ICD-10-CM

## 2011-08-16 MED ORDER — RISEDRONATE SODIUM 35 MG PO TBEC: 1.0000 | DELAYED_RELEASE_TABLET | ORAL | Status: DC

## 2011-09-22 ENCOUNTER — Other Ambulatory Visit: Payer: Self-pay | Admitting: Internal Medicine

## 2011-09-26 ENCOUNTER — Telehealth: Payer: Self-pay | Admitting: *Deleted

## 2011-09-26 DIAGNOSIS — G4700 Insomnia, unspecified: Secondary | ICD-10-CM

## 2011-09-26 MED ORDER — ZOLPIDEM TARTRATE 10 MG PO TABS: 10.0000 mg | ORAL_TABLET | Freq: Every evening | ORAL | Status: DC | PRN

## 2011-09-26 NOTE — Telephone Encounter (Signed)
Pt walked in today requesting Rf on Ambien. Dr. Yetta Barre is out of office. Dr. Posey Rea authorized # [id number] and states pt needs to schedule OV with PCP. Rx printed and given to pt and advised to schedule OV.

## 2011-09-26 NOTE — Telephone Encounter (Signed)
Patient denied follow up appt "stating that she is looking for a new pcp "

## 2011-11-21 ENCOUNTER — Other Ambulatory Visit: Payer: Self-pay | Admitting: Internal Medicine

## 2012-05-29 ENCOUNTER — Encounter: Payer: Self-pay | Admitting: Internal Medicine

## 2012-05-29 ENCOUNTER — Ambulatory Visit (INDEPENDENT_AMBULATORY_CARE_PROVIDER_SITE_OTHER): Payer: Medicare PPO | Admitting: Internal Medicine

## 2012-05-29 VITALS — BP 164/80 | HR 108 | Temp 97.5°F | Wt 146.0 lb

## 2012-05-29 DIAGNOSIS — J40 Bronchitis, not specified as acute or chronic: Secondary | ICD-10-CM

## 2012-05-29 DIAGNOSIS — R05 Cough: Secondary | ICD-10-CM

## 2012-05-29 DIAGNOSIS — J019 Acute sinusitis, unspecified: Secondary | ICD-10-CM

## 2012-05-29 DIAGNOSIS — R059 Cough, unspecified: Secondary | ICD-10-CM

## 2012-05-29 DIAGNOSIS — J069 Acute upper respiratory infection, unspecified: Secondary | ICD-10-CM

## 2012-05-29 DIAGNOSIS — H9190 Unspecified hearing loss, unspecified ear: Secondary | ICD-10-CM

## 2012-05-29 MED ORDER — AZITHROMYCIN 250 MG PO TABS: 250.0000 mg | ORAL_TABLET | ORAL | Status: DC

## 2012-05-29 MED ORDER — HYDROCODONE-HOMATROPINE 5-1.5 MG/5ML PO SYRP: 5.0000 mL | ORAL_SOLUTION | ORAL | Status: DC | PRN

## 2012-05-29 NOTE — Patient Instructions (Addendum)
Chest x-ray when you can today or tomorrow will notify you of results. We will begin you on an antibiotic because of your relapsing symptoms and concern for bacterial chest or sinus infection.  Can use cough medicine as needed at night for comfort.  You need to followup with  primary Dr. if not significantly improved in the next 5 days. Sometimes adding an inhaler like used for asthma can help   if not getting better.

## 2012-05-29 NOTE — Progress Notes (Signed)
Chief Complaint  Patient presents with  . Cough    Pt also has some chest congestion.  Has had this for a month.  Started to feel better but had a relapse.    HPI: Patient comes in today for SDA for  new problem evaluation. Here from another site PCP ;NA last OV in system was 18 months ago .  Co of sx for a month onset like a head cold with sneezing and congestion and started to get better then relapsed with ur drainage and bad cough  Severe causing stress incontinence at times.  No hemoptysis fever but some chills taking otc meds without help . Hard to sleep .   ROS: See pertinent positives and negatives per HPI.  Decrease hearing .  Sleep problems  No hx of asthma copd.  didnt get flu vaccine this year.   Past Medical History  Diagnosis Date  . Hyperlipidemia   . Osteoarthritis   . Osteoporosis   . Anxiety and depression 10/12/2010    Family History  Problem Relation Age of Onset  . Arthritis Other   . Hyperlipidemia Other     History   Social History  . Marital Status: Single    Spouse Name: N/A    Number of Children: N/A  . Years of Education: N/A   Social History Main Topics  . Smoking status: Never Smoker   . Smokeless tobacco: None     Comment: Regular Exercise-No  . Alcohol Use: No  . Drug Use: No  . Sexually Active: Not Currently   Other Topics Concern  . None   Social History Narrative  . None    Outpatient Encounter Prescriptions as of 05/29/2012  Medication Sig Dispense Refill  . Ascorbic Acid (VITAMIN C) 250 MG tablet Take 250 mg by mouth daily.        . diclofenac (VOLTAREN) 75 MG EC tablet TAKE 1 TABLET BY MOUTH TWICE A DAY  60 tablet  4  . diclofenac (VOLTAREN) 75 MG EC tablet TAKE 1 TABLET BY MOUTH TWICE A DAY  180 tablet  2  . Glucosamine-Chondroitin-MSM TABS Take 1 tablet by mouth 2 (two) times daily.        . promethazine (PHENERGAN) 25 MG tablet Take 25 mg by mouth every 6 (six) hours as needed.        . Risedronate Sodium 35 MG TBEC Take 1  tablet (35 mg total) by mouth once a week.  12 tablet  3  . traMADol (ULTRAM) 50 MG tablet TAKE 1 TABLET BY MOUTH 4 TIMES A DAY AS NEEDED FOR PAIN EVERY 4 TO 6 HOURS  120 tablet  9  . zolpidem (AMBIEN) 10 MG tablet Take 1 tablet (10 mg total) by mouth at bedtime as needed.  15 tablet  0  . azithromycin (ZITHROMAX Z-PAK) 250 MG tablet Take 1 tablet (250 mg total) by mouth as directed. Take 2 po first day, then 1 po qd  6 each  0  . HYDROcodone-homatropine (HYCODAN) 5-1.5 MG/5ML syrup Take 5 mLs by mouth every 4 (four) hours as needed for cough.  180 mL  0  . pravastatin (PRAVACHOL) 40 MG tablet TAKE 1 TABLET BY MOUTH EVERY DAY  30 tablet  4    EXAM:  BP 164/80  Pulse 108  Temp 97.5 F (36.4 C) (Oral)  Wt 146 lb (66.225 kg)  SpO2 98%  There is no height on file to calculate BMI.  GENERAL: vitals reviewed and listed above,  alert, oriented, appears well hydrated and in no acute distress Hard of hearing  spasmodic deep cough ,.  WDWN in NAD  quiet respirations; mildly congested  somewhat hoarse. Non toxic . bac deep bronchial wheezy cough   HEENT: Normocephalic ;atraumatic , Eyes;  PERRL, EOMs  Full, lids and conjunctiva clear,,Ears: no deformities, canals nl, TM landmarks normal, Nose: no deformity or discharge but congested;face minimally tender Mouth : OP clear without lesion or edema . Neck: Supple without adenopathy or masses or bruits Chest:   ? Few rhionchi right   bs seen good few exp  Sounds right  CV:  S1-S2 no gallops or murmurs peripheral perfusion is normal Skin :nl perfusion and no acute rashes   ASSESSMENT AND PLAN:  Discussed the following assessment and plan:  1. Cough, persistent  DG Chest 2 View  2. Wheezy bronchitis    3. Protracted URI  DG Chest 2 View  4. Acute sinusitis with symptoms greater than 10 days    5. Hard of hearing      Expectant management.  discussed alarm findings  And fu . Plans on getting  c xray tomorrow .   -Patient advised to return or  notify health care team  immediately if symptoms worsen or persist or new concerns arise.  Patient Instructions  Chest x-ray when you can today or tomorrow will notify you of results. We will begin you on an antibiotic because of your relapsing symptoms and concern for bacterial chest or sinus infection.  Can use cough medicine as needed at night for comfort.  You need to followup with  primary Dr. if not significantly improved in the next 5 days. Sometimes adding an inhaler like used for asthma can help   if not getting better.     Neta Mends. Tylesha Gibeault M.D.  Pt was concerned about her chronic sleep problem which i told her to discuss with pcp or other as a long term problem .  Also didn't address her BP today she was on the way to visit her son who had leg amputation this week

## 2012-05-30 ENCOUNTER — Ambulatory Visit (INDEPENDENT_AMBULATORY_CARE_PROVIDER_SITE_OTHER)
Admission: RE | Admit: 2012-05-30 | Discharge: 2012-05-30 | Disposition: A | Discharge status: Home / Self Care | Payer: Medicare PPO | Source: Ambulatory Visit | Attending: Internal Medicine | Admitting: Internal Medicine

## 2012-05-30 DIAGNOSIS — R05 Cough: Secondary | ICD-10-CM

## 2012-05-30 DIAGNOSIS — J069 Acute upper respiratory infection, unspecified: Secondary | ICD-10-CM

## 2012-05-31 ENCOUNTER — Telehealth: Payer: Self-pay | Admitting: Family Medicine

## 2012-05-31 NOTE — Telephone Encounter (Signed)
This patient came to Dr. Fabian Sharp on 05/29/12 for cough.  She had a chest x-ray done that is negative for PNA but did show a compression fracture.  I tried to reach the patient.  She has been staying in a hotel.  The front desk clerk informed me that she is no longer living there.  I have no other way to reach her.  I just wanted to notify you.

## 2012-09-26 ENCOUNTER — Ambulatory Visit: Payer: Medicare PPO | Admitting: Internal Medicine

## 2012-09-26 DIAGNOSIS — Z0289 Encounter for other administrative examinations: Secondary | ICD-10-CM

## 2013-03-17 ENCOUNTER — Ambulatory Visit (INDEPENDENT_AMBULATORY_CARE_PROVIDER_SITE_OTHER): Payer: Medicare PPO | Admitting: Internal Medicine

## 2013-03-17 ENCOUNTER — Other Ambulatory Visit (INDEPENDENT_AMBULATORY_CARE_PROVIDER_SITE_OTHER): Payer: Medicare PPO

## 2013-03-17 ENCOUNTER — Encounter: Payer: Self-pay | Admitting: Internal Medicine

## 2013-03-17 VITALS — BP 136/82 | HR 76 | Temp 96.8°F | Resp 16 | Ht 59.0 in | Wt 146.0 lb

## 2013-03-17 DIAGNOSIS — M810 Age-related osteoporosis without current pathological fracture: Secondary | ICD-10-CM

## 2013-03-17 DIAGNOSIS — S22009S Unspecified fracture of unspecified thoracic vertebra, sequela: Secondary | ICD-10-CM

## 2013-03-17 DIAGNOSIS — E785 Hyperlipidemia, unspecified: Secondary | ICD-10-CM

## 2013-03-17 DIAGNOSIS — Z0000 Encounter for general adult medical examination without abnormal findings: Secondary | ICD-10-CM

## 2013-03-17 DIAGNOSIS — G4700 Insomnia, unspecified: Secondary | ICD-10-CM

## 2013-03-17 DIAGNOSIS — M1990 Unspecified osteoarthritis, unspecified site: Secondary | ICD-10-CM

## 2013-03-17 DIAGNOSIS — IMO0002 Reserved for concepts with insufficient information to code with codable children: Secondary | ICD-10-CM

## 2013-03-17 DIAGNOSIS — Z23 Encounter for immunization: Secondary | ICD-10-CM

## 2013-03-17 DIAGNOSIS — R7309 Other abnormal glucose: Secondary | ICD-10-CM

## 2013-03-17 DIAGNOSIS — Z1231 Encounter for screening mammogram for malignant neoplasm of breast: Secondary | ICD-10-CM

## 2013-03-17 DIAGNOSIS — H9193 Unspecified hearing loss, bilateral: Secondary | ICD-10-CM

## 2013-03-17 DIAGNOSIS — H9190 Unspecified hearing loss, unspecified ear: Secondary | ICD-10-CM

## 2013-03-17 LAB — LDL CHOLESTEROL, DIRECT: Direct LDL: 180.2 mg/dL

## 2013-03-17 LAB — COMPREHENSIVE METABOLIC PANEL
ALT: 13 U/L (ref 0–35)
AST: 24 U/L (ref 0–37)
Albumin: 4.3 g/dL (ref 3.5–5.2)
Alkaline Phosphatase: 72 U/L (ref 39–117)
Calcium: 10.1 mg/dL (ref 8.4–10.5)
Chloride: 103 mEq/L (ref 96–112)
Creatinine, Ser: 1.3 mg/dL — ABNORMAL HIGH (ref 0.4–1.2)
Potassium: 3.7 mEq/L (ref 3.5–5.1)
Sodium: 142 mEq/L (ref 135–145)
Total Protein: 7.7 g/dL (ref 6.0–8.3)

## 2013-03-17 LAB — URINALYSIS, ROUTINE W REFLEX MICROSCOPIC
Bilirubin Urine: NEGATIVE
Hgb urine dipstick: NEGATIVE
Nitrite: NEGATIVE
Total Protein, Urine: NEGATIVE
Urine Glucose: NEGATIVE

## 2013-03-17 LAB — LIPID PANEL
HDL: 47 mg/dL (ref >39.00)
Total CHOL/HDL Ratio: 5
VLDL: 32.6 mg/dL (ref 0.0–40.0)

## 2013-03-17 LAB — CBC WITH DIFFERENTIAL/PLATELET
Basophils Absolute: 0 10*3/uL (ref 0.0–0.1)
Eosinophils Absolute: 0.1 10*3/uL (ref 0.0–0.7)
HCT: 43 % (ref 36.0–46.0)
Hemoglobin: 14.9 g/dL (ref 12.0–15.0)
Lymphocytes Relative: 29.7 % (ref 12.0–46.0)
Lymphs Abs: 2.6 10*3/uL (ref 0.7–4.0)
MCHC: 34.7 g/dL (ref 30.0–36.0)
Neutro Abs: 5.4 10*3/uL (ref 1.4–7.7)
Platelets: 299 10*3/uL (ref 150.0–400.0)
RDW: 13.1 % (ref 11.5–14.6)

## 2013-03-17 LAB — TSH: TSH: 2.76 u[IU]/mL (ref 0.35–5.50)

## 2013-03-17 MED ORDER — TRAMADOL HCL 50 MG PO TABS: 50.0000 mg | ORAL_TABLET | Freq: Four times a day (QID) | ORAL | Status: DC | PRN

## 2013-03-17 MED ORDER — RISEDRONATE SODIUM 35 MG PO TBEC: 1.0000 | DELAYED_RELEASE_TABLET | ORAL | Status: AC

## 2013-03-17 MED ORDER — PRAVASTATIN SODIUM 40 MG PO TABS: 40.0000 mg | ORAL_TABLET | Freq: Every day | ORAL | Status: AC

## 2013-03-17 MED ORDER — DICLOFENAC SODIUM 75 MG PO TBEC: 75.0000 mg | DELAYED_RELEASE_TABLET | Freq: Two times a day (BID) | ORAL | Status: DC

## 2013-03-17 MED ORDER — ZOLPIDEM TARTRATE 10 MG PO TABS: 10.0000 mg | ORAL_TABLET | Freq: Every evening | ORAL | Status: DC | PRN

## 2013-03-17 NOTE — Progress Notes (Signed)
Subjective:    Patient ID: Cindy Clarke, female    DOB: 08-23-1930, 79 y.o.   MRN: [id number]  Arthritis Presents for follow-up visit. She complains of pain. She reports no stiffness, joint swelling or joint warmth. Affected locations include the right knee and left knee. Her pain is at a severity of 3/10. Associated symptoms include pain at night and pain while resting. Pertinent negatives include no diarrhea, dry eyes, dry mouth, dysuria, fatigue, fever, rash, Raynaud's syndrome, uveitis or weight loss. Her past medical history is significant for osteoarthritis. Her pertinent risk factors include overuse. Past treatments include acetaminophen and NSAIDs. The treatment provided moderate relief. Factors aggravating her arthritis include activity. Compliance with prior treatments has been poor. Prior compliance problems include difficulty with treatment plan.      Review of Systems  Constitutional: Positive for unexpected weight change (wt gain). Negative for fever, chills, weight loss, diaphoresis, activity change, appetite change and fatigue.  HENT: Negative.   Eyes: Negative.   Respiratory: Negative.  Negative for apnea, cough, choking, chest tightness, shortness of breath, wheezing and stridor.   Cardiovascular: Negative.  Negative for chest pain, palpitations and leg swelling.  Gastrointestinal: Negative.  Negative for nausea, vomiting, abdominal pain, diarrhea, constipation, blood in stool, abdominal distention, anal bleeding and rectal pain.  Endocrine: Negative.  Negative for polydipsia, polyphagia and polyuria.  Genitourinary: Negative.  Negative for dysuria.  Musculoskeletal: Positive for arthralgias and arthritis. Negative for gait problem, joint swelling, myalgias, neck pain, neck stiffness and stiffness.  Skin: Negative.  Negative for rash.  Allergic/Immunologic: Negative.   Neurological: Negative.   Hematological: Negative.  Negative for adenopathy. Does not bruise/bleed  easily.  Psychiatric/Behavioral: Positive for sleep disturbance. Negative for suicidal ideas, hallucinations, behavioral problems, confusion, self-injury, dysphoric mood, decreased concentration and agitation. The patient is nervous/anxious. The patient is not hyperactive.        Objective:   Physical Exam  Vitals reviewed. Constitutional: She is oriented to person, place, and time. She appears well-developed and well-nourished. No distress.  HENT:  Head: Normocephalic and atraumatic.  Mouth/Throat: Oropharynx is clear and moist. No oropharyngeal exudate.  Eyes: Conjunctivae are normal. Right eye exhibits no discharge. Left eye exhibits no discharge. No scleral icterus.  Neck: Normal range of motion. Neck supple. No JVD present. No tracheal deviation present. No thyromegaly present.  Cardiovascular: Normal rate, regular rhythm, normal heart sounds and intact distal pulses.  Exam reveals no gallop and no friction rub.   No murmur heard. Pulmonary/Chest: Effort normal and breath sounds normal. No accessory muscle usage or stridor. Not tachypneic. No respiratory distress. She has no wheezes. She has no rales. Chest wall is not dull to percussion. She exhibits no mass, no tenderness, no bony tenderness, no laceration, no crepitus, no edema, no deformity, no swelling and no retraction. Right breast exhibits no inverted nipple, no mass, no nipple discharge, no skin change and no tenderness. Left breast exhibits no inverted nipple, no mass, no nipple discharge, no skin change and no tenderness. Breasts are symmetrical.  Abdominal: Soft. Bowel sounds are normal. She exhibits no distension and no mass. There is no tenderness. There is no rebound and no guarding.  Musculoskeletal: Normal range of motion. She exhibits no edema and no tenderness.  Lymphadenopathy:    She has no cervical adenopathy.  Neurological: She is oriented to person, place, and time.  Skin: Skin is warm and dry. No rash noted. She is  not diaphoretic. No erythema. No pallor.  Psychiatric: Her speech is  normal and behavior is normal. Judgment and thought content normal. Her mood appears anxious. Her affect is not angry, not blunt, not labile and not inappropriate. She is not agitated, not aggressive, not hyperactive, not slowed, not withdrawn, not actively hallucinating and not combative. Cognition and memory are normal. She does not exhibit a depressed mood. She is attentive.     Lab Results  Component Value Date   WBC 7.7 05/11/2010   HGB 13.6 10/05/2010   HCT 40.0 10/05/2010   PLT 316.0 05/11/2010   GLUCOSE 113* 10/05/2010   CHOL 232* 12/30/2009   TRIG 153.0* 12/30/2009   HDL 45.80 12/30/2009   LDLDIRECT 163.1 12/30/2009   ALT 25 05/11/2010   AST 25 05/11/2010   NA 141 10/05/2010   K 3.6 10/05/2010   CL 108 10/05/2010   CREATININE 1.20 10/05/2010   BUN 28* 10/05/2010   CO2 30 05/11/2010   TSH 1.58 05/11/2010       Assessment & Plan:

## 2013-03-17 NOTE — Patient Instructions (Signed)

## 2013-03-18 ENCOUNTER — Encounter: Payer: Self-pay | Admitting: Internal Medicine

## 2013-03-18 DIAGNOSIS — Z0000 Encounter for general adult medical examination without abnormal findings: Secondary | ICD-10-CM | POA: Insufficient documentation

## 2013-03-18 DIAGNOSIS — Z23 Encounter for immunization: Secondary | ICD-10-CM | POA: Insufficient documentation

## 2013-03-18 DIAGNOSIS — R7309 Other abnormal glucose: Secondary | ICD-10-CM | POA: Insufficient documentation

## 2013-03-18 LAB — VITAMIN D 25 HYDROXY (VIT D DEFICIENCY, FRACTURES): Vit D, 25-Hydroxy: 105 ng/mL — ABNORMAL HIGH (ref 30–89)

## 2013-03-18 NOTE — Assessment & Plan Note (Signed)
She will take tramadol for the pain

## 2013-03-18 NOTE — Assessment & Plan Note (Signed)
She is due for a DEXA scan 

## 2013-03-18 NOTE — Assessment & Plan Note (Addendum)

## 2013-03-18 NOTE — Assessment & Plan Note (Signed)
I have asked her to restart pravachol Today I will check her FLP TSH CMP

## 2013-03-18 NOTE — Assessment & Plan Note (Signed)
I will check her A1C to see if she has DM2 

## 2016-06-20 ENCOUNTER — Emergency Department (HOSPITAL_COMMUNITY): Payer: Medicare HMO

## 2016-06-20 ENCOUNTER — Inpatient Hospital Stay (HOSPITAL_COMMUNITY)
Admission: EM | Admit: 2016-06-20 | Discharge: 2016-06-29 | DRG: 470 | Disposition: A | Discharge status: Skilled Nursing Facility | Payer: Medicare HMO | Attending: Internal Medicine | Admitting: Internal Medicine

## 2016-06-20 ENCOUNTER — Encounter (HOSPITAL_COMMUNITY): Payer: Self-pay | Admitting: *Deleted

## 2016-06-20 DIAGNOSIS — Z471 Aftercare following joint replacement surgery: Secondary | ICD-10-CM | POA: Diagnosis not present

## 2016-06-20 DIAGNOSIS — R2689 Other abnormalities of gait and mobility: Secondary | ICD-10-CM | POA: Diagnosis not present

## 2016-06-20 DIAGNOSIS — M25552 Pain in left hip: Secondary | ICD-10-CM | POA: Diagnosis not present

## 2016-06-20 DIAGNOSIS — D72829 Elevated white blood cell count, unspecified: Secondary | ICD-10-CM | POA: Diagnosis not present

## 2016-06-20 DIAGNOSIS — I517 Cardiomegaly: Secondary | ICD-10-CM | POA: Diagnosis not present

## 2016-06-20 DIAGNOSIS — F4320 Adjustment disorder, unspecified: Secondary | ICD-10-CM | POA: Diagnosis not present

## 2016-06-20 DIAGNOSIS — M810 Age-related osteoporosis without current pathological fracture: Secondary | ICD-10-CM | POA: Diagnosis present

## 2016-06-20 DIAGNOSIS — G4700 Insomnia, unspecified: Secondary | ICD-10-CM

## 2016-06-20 DIAGNOSIS — E876 Hypokalemia: Secondary | ICD-10-CM | POA: Diagnosis present

## 2016-06-20 DIAGNOSIS — Y92481 Parking lot as the place of occurrence of the external cause: Secondary | ICD-10-CM | POA: Diagnosis not present

## 2016-06-20 DIAGNOSIS — R451 Restlessness and agitation: Secondary | ICD-10-CM | POA: Diagnosis present

## 2016-06-20 DIAGNOSIS — N183 Chronic kidney disease, stage 3 (moderate): Secondary | ICD-10-CM | POA: Diagnosis present

## 2016-06-20 DIAGNOSIS — S72009A Fracture of unspecified part of neck of unspecified femur, initial encounter for closed fracture: Secondary | ICD-10-CM | POA: Diagnosis not present

## 2016-06-20 DIAGNOSIS — W19XXXA Unspecified fall, initial encounter: Secondary | ICD-10-CM | POA: Diagnosis not present

## 2016-06-20 DIAGNOSIS — M170 Bilateral primary osteoarthritis of knee: Secondary | ICD-10-CM | POA: Diagnosis present

## 2016-06-20 DIAGNOSIS — R05 Cough: Secondary | ICD-10-CM | POA: Diagnosis not present

## 2016-06-20 DIAGNOSIS — R0989 Other specified symptoms and signs involving the circulatory and respiratory systems: Secondary | ICD-10-CM | POA: Diagnosis not present

## 2016-06-20 DIAGNOSIS — I519 Heart disease, unspecified: Secondary | ICD-10-CM | POA: Diagnosis not present

## 2016-06-20 DIAGNOSIS — S72012A Unspecified intracapsular fracture of left femur, initial encounter for closed fracture: Secondary | ICD-10-CM | POA: Diagnosis not present

## 2016-06-20 DIAGNOSIS — W1830XA Fall on same level, unspecified, initial encounter: Secondary | ICD-10-CM | POA: Diagnosis present

## 2016-06-20 DIAGNOSIS — Z79899 Other long term (current) drug therapy: Secondary | ICD-10-CM | POA: Diagnosis not present

## 2016-06-20 DIAGNOSIS — Z4789 Encounter for other orthopedic aftercare: Secondary | ICD-10-CM | POA: Diagnosis not present

## 2016-06-20 DIAGNOSIS — M1990 Unspecified osteoarthritis, unspecified site: Secondary | ICD-10-CM | POA: Diagnosis present

## 2016-06-20 DIAGNOSIS — W010XXA Fall on same level from slipping, tripping and stumbling without subsequent striking against object, initial encounter: Secondary | ICD-10-CM | POA: Diagnosis not present

## 2016-06-20 DIAGNOSIS — E785 Hyperlipidemia, unspecified: Secondary | ICD-10-CM | POA: Diagnosis not present

## 2016-06-20 DIAGNOSIS — T148XXA Other injury of unspecified body region, initial encounter: Secondary | ICD-10-CM | POA: Diagnosis not present

## 2016-06-20 DIAGNOSIS — M80852D Other osteoporosis with current pathological fracture, left femur, subsequent encounter for fracture with routine healing: Secondary | ICD-10-CM | POA: Diagnosis not present

## 2016-06-20 DIAGNOSIS — W101XXA Fall (on)(from) sidewalk curb, initial encounter: Secondary | ICD-10-CM | POA: Diagnosis present

## 2016-06-20 DIAGNOSIS — S72002A Fracture of unspecified part of neck of left femur, initial encounter for closed fracture: Secondary | ICD-10-CM

## 2016-06-20 DIAGNOSIS — R531 Weakness: Secondary | ICD-10-CM | POA: Diagnosis not present

## 2016-06-20 DIAGNOSIS — Z90710 Acquired absence of both cervix and uterus: Secondary | ICD-10-CM

## 2016-06-20 DIAGNOSIS — R7989 Other specified abnormal findings of blood chemistry: Secondary | ICD-10-CM

## 2016-06-20 DIAGNOSIS — Z9889 Other specified postprocedural states: Secondary | ICD-10-CM | POA: Diagnosis not present

## 2016-06-20 DIAGNOSIS — N179 Acute kidney failure, unspecified: Secondary | ICD-10-CM | POA: Diagnosis not present

## 2016-06-20 DIAGNOSIS — S72002P Fracture of unspecified part of neck of left femur, subsequent encounter for closed fracture with malunion: Secondary | ICD-10-CM | POA: Diagnosis not present

## 2016-06-20 DIAGNOSIS — M1611 Unilateral primary osteoarthritis, right hip: Secondary | ICD-10-CM | POA: Diagnosis not present

## 2016-06-20 DIAGNOSIS — I130 Hypertensive heart and chronic kidney disease with heart failure and stage 1 through stage 4 chronic kidney disease, or unspecified chronic kidney disease: Secondary | ICD-10-CM | POA: Diagnosis not present

## 2016-06-20 DIAGNOSIS — I5030 Unspecified diastolic (congestive) heart failure: Secondary | ICD-10-CM | POA: Diagnosis not present

## 2016-06-20 DIAGNOSIS — S72002S Fracture of unspecified part of neck of left femur, sequela: Secondary | ICD-10-CM | POA: Diagnosis not present

## 2016-06-20 DIAGNOSIS — I5032 Chronic diastolic (congestive) heart failure: Secondary | ICD-10-CM | POA: Diagnosis not present

## 2016-06-20 DIAGNOSIS — M6281 Muscle weakness (generalized): Secondary | ICD-10-CM | POA: Diagnosis not present

## 2016-06-20 DIAGNOSIS — F22 Delusional disorders: Secondary | ICD-10-CM | POA: Diagnosis present

## 2016-06-20 DIAGNOSIS — H9193 Unspecified hearing loss, bilateral: Secondary | ICD-10-CM | POA: Diagnosis present

## 2016-06-20 DIAGNOSIS — I110 Hypertensive heart disease with heart failure: Secondary | ICD-10-CM | POA: Diagnosis not present

## 2016-06-20 DIAGNOSIS — I10 Essential (primary) hypertension: Secondary | ICD-10-CM | POA: Diagnosis not present

## 2016-06-20 DIAGNOSIS — R059 Cough, unspecified: Secondary | ICD-10-CM

## 2016-06-20 DIAGNOSIS — Z888 Allergy status to other drugs, medicaments and biological substances status: Secondary | ICD-10-CM | POA: Diagnosis not present

## 2016-06-20 DIAGNOSIS — Z79891 Long term (current) use of opiate analgesic: Secondary | ICD-10-CM | POA: Diagnosis not present

## 2016-06-20 DIAGNOSIS — I5189 Other ill-defined heart diseases: Secondary | ICD-10-CM

## 2016-06-20 DIAGNOSIS — N189 Chronic kidney disease, unspecified: Secondary | ICD-10-CM | POA: Diagnosis present

## 2016-06-20 DIAGNOSIS — S72042A Displaced fracture of base of neck of left femur, initial encounter for closed fracture: Secondary | ICD-10-CM | POA: Diagnosis not present

## 2016-06-20 DIAGNOSIS — Z9181 History of falling: Secondary | ICD-10-CM | POA: Diagnosis not present

## 2016-06-20 LAB — BASIC METABOLIC PANEL
Anion gap: 11 (ref 5–15)
BUN: 35 mg/dL — ABNORMAL HIGH (ref 6–20)
CALCIUM: 9.8 mg/dL (ref 8.9–10.3)
CO2: 24 mmol/L (ref 22–32)
CREATININE: 1.67 mg/dL — AB (ref 0.44–1.00)
Chloride: 105 mmol/L (ref 101–111)
GFR calc non Af Amer: 27 mL/min — ABNORMAL LOW (ref >60)
GFR, EST AFRICAN AMERICAN: 32 mL/min — AB (ref >60)
Glucose, Bld: 102 mg/dL — ABNORMAL HIGH (ref 65–99)
Potassium: 4 mmol/L (ref 3.5–5.1)
SODIUM: 140 mmol/L (ref 135–145)

## 2016-06-20 LAB — CBC WITH DIFFERENTIAL/PLATELET
BASOS PCT: 1 %
Basophils Absolute: 0.1 10*3/uL (ref 0.0–0.1)
EOS ABS: 0.2 10*3/uL (ref 0.0–0.7)
Eosinophils Relative: 2 %
HCT: 41.7 % (ref 36.0–46.0)
HEMOGLOBIN: 14.5 g/dL (ref 12.0–15.0)
Lymphocytes Relative: 17 %
Lymphs Abs: 1.7 10*3/uL (ref 0.7–4.0)
MCH: 30.7 pg (ref 26.0–34.0)
MCHC: 34.8 g/dL (ref 30.0–36.0)
MCV: 88.3 fL (ref 78.0–100.0)
MONOS PCT: 5 %
Monocytes Absolute: 0.5 10*3/uL (ref 0.1–1.0)
Neutro Abs: 7.7 10*3/uL (ref 1.7–7.7)
Neutrophils Relative %: 75 %
Platelets: 328 10*3/uL (ref 150–400)
RBC: 4.72 MIL/uL (ref 3.87–5.11)
RDW: 12.5 % (ref 11.5–15.5)
WBC: 10.2 10*3/uL (ref 4.0–10.5)

## 2016-06-20 LAB — TYPE AND SCREEN
ABO/RH(D): O NEG
ANTIBODY SCREEN: NEGATIVE

## 2016-06-20 LAB — ABO/RH: ABO/RH(D): O NEG

## 2016-06-20 LAB — PROTIME-INR
INR: 1.01
Prothrombin Time: 13.3 seconds (ref 11.4–15.2)

## 2016-06-20 MED ORDER — ONDANSETRON HCL 4 MG/2ML IJ SOLN
4.0000 mg | Freq: Once | INTRAMUSCULAR | Status: AC
  Administered 2016-06-20: 4 mg via INTRAVENOUS
  Filled 2016-06-20: qty 2

## 2016-06-20 MED ORDER — HEPARIN SODIUM (PORCINE) 5000 UNIT/ML IJ SOLN
5000.0000 [IU] | Freq: Three times a day (TID) | INTRAMUSCULAR | Status: AC
  Administered 2016-06-20 – 2016-06-21 (×2): 5000 [IU] via SUBCUTANEOUS
  Filled 2016-06-20 (×3): qty 1

## 2016-06-20 MED ORDER — HYDROMORPHONE HCL 2 MG/ML IJ SOLN
0.5000 mg | INTRAMUSCULAR | Status: DC | PRN
  Administered 2016-06-20 – 2016-06-28 (×31): 0.5 mg via INTRAVENOUS
  Filled 2016-06-20 (×32): qty 1

## 2016-06-20 MED ORDER — SODIUM CHLORIDE 0.9 % IV SOLN
INTRAVENOUS | Status: DC
  Administered 2016-06-20 (×2): via INTRAVENOUS

## 2016-06-20 MED ORDER — ONDANSETRON HCL 4 MG/2ML IJ SOLN
4.0000 mg | Freq: Four times a day (QID) | INTRAMUSCULAR | Status: DC | PRN
  Administered 2016-06-21: 4 mg via INTRAVENOUS
  Filled 2016-06-20: qty 2

## 2016-06-20 MED ORDER — HYDROMORPHONE HCL 1 MG/ML IJ SOLN
0.5000 mg | INTRAMUSCULAR | Status: AC | PRN
  Administered 2016-06-20 (×2): 0.5 mg via INTRAVENOUS
  Filled 2016-06-20 (×2): qty 1

## 2016-06-20 MED ORDER — SENNOSIDES-DOCUSATE SODIUM 8.6-50 MG PO TABS
1.0000 | ORAL_TABLET | Freq: Every day | ORAL | Status: DC
  Administered 2016-06-21 – 2016-06-29 (×8): 1 via ORAL
  Filled 2016-06-20 (×8): qty 1

## 2016-06-20 MED ORDER — HYDRALAZINE HCL 20 MG/ML IJ SOLN
5.0000 mg | INTRAMUSCULAR | Status: DC | PRN
  Administered 2016-06-20 – 2016-06-22 (×4): 5 mg via INTRAVENOUS
  Filled 2016-06-20 (×5): qty 1

## 2016-06-20 NOTE — Consult Note (Signed)
ORTHOPAEDIC CONSULTATION  REQUESTING PHYSICIAN: Yolonda Kida, MD  PCP:  No primary care provider on file.  Chief Complaint: Left hip femoral neck fracture  HPI: Cindy Clarke is a 83 y.o. female who complains of  Left hip pain following a mechanical fall today at [employer] parking lot.  She states she shad a mis-step leaving their showroom and sustained a fall onto the left hip.  At her baseline she ambulates well without assistive devices and has become less physically active over the last few months due to knee arthritis.  She lives independently at home along in [place], [place].  She was visiting her son in [place] today before the fall.  Her hope is to return home to [facility] after surgery.  She denies any antecedent hip pain or current distal neuro sypmtoms.  Past Medical History:  Diagnosis Date  . Anxiety and depression 10/12/2010  . Hyperlipidemia   . Osteoarthritis   . Osteoporosis    Past Surgical History:  Procedure Laterality Date  . ABDOMINAL HYSTERECTOMY     Social History   Social History  . Marital status: Single    Spouse name: N/A  . Number of children: N/A  . Years of education: N/A   Social History Main Topics  . Smoking status: Never Smoker  . Smokeless tobacco: Never Used     Comment: Regular Exercise-No  . Alcohol use No  . Drug use: No  . Sexual activity: Not Currently   Other Topics Concern  . Not on file   Social History Narrative  . No narrative on file   Family History  Problem Relation Age of Onset  . Arthritis Other   . Hyperlipidemia Other    Allergies  Allergen Reactions  . Influenza Vaccines Nausea And Vomiting  . Niacin Palpitations   Prior to Admission medications   Medication Sig Start Date End Date Taking? Authorizing Provider  Ascorbic Acid (VITAMIN C PO) Take 2 tablets by mouth 2 (two) times daily.    Historical Provider, MD  CALCIUM PO Take 1 tablet by mouth 2 (two) times daily.     Historical Provider, MD  Cholecalciferol (VITAMIN D PO) Take 2 tablets by mouth at bedtime.     Historical Provider, MD  Misc Natural Products (OSTEO BI-FLEX ADV JOINT SHIELD PO) Take 1 tablet by mouth 2 (two) times daily.     Historical Provider, MD  Multiple Vitamins-Minerals (CENTRUM SILVER PO) Take 1 tablet by mouth daily.     Historical Provider, MD  naproxen sodium (ANAPROX) 220 MG tablet Take 440 mg by mouth 3 (three) times daily as needed (for pain).    Historical Provider, MD  pravastatin (PRAVACHOL) 40 MG tablet Take 1 tablet (40 mg total) by mouth daily. Patient not taking: Reported on 06/20/2016 03/17/13   Etta Grandchild, MD  Risedronate Sodium 35 MG TBEC Take 1 tablet (35 mg total) by mouth once a week. Patient not taking: Reported on 06/20/2016 03/17/13   Etta Grandchild, MD  zolpidem (AMBIEN) 10 MG tablet Take 1 tablet (10 mg total) by mouth at bedtime as needed. Patient not taking: Reported on 06/20/2016 03/17/13   Etta Grandchild, MD   Dg Chest 1 View  Result Date: 06/20/2016 CLINICAL DATA:  Acute left hip fracture EXAM: CHEST 1 VIEW COMPARISON:  05/30/2012 FINDINGS: Mild cardiomegaly with central vascular congestion. No focal pneumonia, collapse or consolidation. Negative for edema, effusion or pneumothorax. Trachea is midline. Atherosclerosis noted of the aorta. Degenerative  changes noted spine. Advanced arthropathy of both shoulders with humeral head deformities. IMPRESSION: Cardiomegaly with vascular congestion. Negative for CHF or pneumonia Thoracic aortic atherosclerosis Electronically Signed   By: Judie Petit.  Shick M.D.   On: 06/20/2016 14:57   Dg Hip Unilat With Pelvis 2-3 Views Left  Result Date: 06/20/2016 CLINICAL DATA:  Recent fall, left hip injury, acute pain EXAM: DG HIP (WITH OR WITHOUT PELVIS) 2-3V LEFT COMPARISON:  None available FINDINGS: There is an acute left hip subcapital femoral neck fracture with minimal displacement and impaction. No associated hip subluxation or  dislocation. Bony pelvis appears intact. No displaced fracture. Bowel artifact overlies the rami. No diastases. Degenerative changes of the lower lumbar spine and SI joints. Right hip appears unremarkable. IMPRESSION: Acute minimally displaced and impacted left subcapital femoral neck fracture. Electronically Signed   By: Judie Petit.  Shick M.D.   On: 06/20/2016 14:50    Positive ROS: All other systems have been reviewed and were otherwise negative with the exception of those mentioned in the HPI and as above.  Physical Exam: General: Alert, no acute distress Cardiovascular: No pedal edema Respiratory: No cyanosis, no use of accessory musculature GI: No organomegaly, abdomen is soft and non-tender Skin: No lesions in the area of chief complaint Neurologic: Sensation intact distally Psychiatric: Patient is competent for consent with normal mood and affect Lymphatic: No axillary or cervical lymphadenopathy  MUSCULOSKELETAL: LLE- shortened and ER , otherwise distally NVI.  2+ DP pulse  Assessment: Left hip femoral neck fracture  Plan: -will need hemiarthroplasty for femoral neck fracture -I have her scheduled for surgery at [facility] on Thursday at 245 pm, please arrange for transfer as able to help facilitate her care there -NWB until surgery on LLE -lovenox tomorrow for DVT ppx -NPO tomorrow night at [facility] -The risks, benefits, and alternatives were discussed with the patient. There are risks associated with the surgery including, but not limited to, problems with anesthesia (death), infection, differences in leg length/angulation/rotation, fracture of bones, loosening or failure of implants, malunion, nonunion, hematoma (blood accumulation) which may require surgical drainage, blood clots, pulmonary embolism, nerve injury (foot drop), and blood vessel injury. The patient understands these risks and elects to proceed.     Yolonda Kida, MD Cell [phone]    06/20/2016 9:39 PM

## 2016-06-20 NOTE — ED Notes (Signed)
Pt is in [facility] bed, no scanner available

## 2016-06-20 NOTE — ED Notes (Signed)
Bed: [facility] Expected date:  Expected time:  Means of arrival:  Comments: 

## 2016-06-20 NOTE — ED Notes (Signed)
Report given to 5W RN

## 2016-06-20 NOTE — ED Triage Notes (Signed)
Per EMS pt was shopping, when coming out of the store, pt tripped and fell, c/o left hip pain. Per EMS there is an obvious left leg shortening and rotation. Pt is in [place] visiting sick son.

## 2016-06-20 NOTE — ED Provider Notes (Addendum)
[facility]-EMERGENCY DEPT Provider Note   CSN: [id number] Arrival date & time: 06/20/16  1316     History   Chief Complaint Chief Complaint  Patient presents with  . Fall  . Hip Pain    HPI Cindy Clarke is a 83 y.o. female.  Patient was walking out of the [employer] today and she was stepping down off the curb when she fell onto her left hip. She has severe pain in the left hip, deformity and has been unable to stand.   The history is provided by the patient.  Fall  This is a new problem. The current episode started less than 1 hour ago. The problem occurs constantly. The problem has not changed since onset.Associated symptoms comments: Pain in the left hip and unable to walk.  No head injury or LOC.  No other c/o.  No numbness or tingling in the left foot. Exacerbated by: movement. Nothing relieves the symptoms. She has tried nothing for the symptoms. The treatment provided no relief.  Hip Pain     Past Medical History:  Diagnosis Date  . Anxiety and depression 10/12/2010  . Hyperlipidemia   . Osteoarthritis   . Osteoporosis     Patient Active Problem List   Diagnosis Date Noted  . Other abnormal glucose 03/18/2013  . Need for prophylactic vaccination against Streptococcus pneumoniae (pneumococcus) 03/18/2013  . Routine general medical examination at a health care facility 03/18/2013  . Other screening mammogram 03/17/2013  . Hard of hearing 05/29/2012  . Thoracic vertebral fracture (HCC) 10/26/2010  . Insomnia 09/15/2010  . Allergic rhinitis due to other allergen 04/21/2010  . Hyperlipidemia LDL goal < 130 12/30/2009  . OSTEOARTHRITIS 12/30/2009  . OSTEOPOROSIS 12/30/2009    Past Surgical History:  Procedure Laterality Date  . ABDOMINAL HYSTERECTOMY      OB History    No data available       Home Medications    Prior to Admission medications   Medication Sig Start Date End Date Taking? Authorizing Provider  Ascorbic Acid (VITAMIN C) 1000  MG tablet Take 1,000 mg by mouth 2 (two) times daily.    Historical Provider, MD  Calcium Carbonate (CALCIUM 600 PO) Take by mouth 2 (two) times daily.    Historical Provider, MD  cetirizine (ZYRTEC) 10 MG tablet Take 10 mg by mouth daily.    Historical Provider, MD  diclofenac (VOLTAREN) 75 MG EC tablet Take 1 tablet (75 mg total) by mouth 2 (two) times daily. 03/17/13   Etta Grandchild, MD  Glucosamine-Chondroitin-MSM TABS Take 1 tablet by mouth 2 (two) times daily.      Historical Provider, MD  HYDROcodone-homatropine (HYCODAN) 5-1.5 MG/5ML syrup Take 5 mLs by mouth every 4 (four) hours as needed for cough. 05/29/12   Madelin Headings, MD  Ibuprofen-Diphenhydramine Cit (ADVIL PM PO) Take 2 each by mouth at bedtime.    Historical Provider, MD  Magnesium 400 MG CAPS Take 1 each by mouth daily.    Historical Provider, MD  Misc Natural Products (OSTEO BI-FLEX ADV JOINT SHIELD PO) Take by mouth 2 (two) times daily.    Historical Provider, MD  Multiple Vitamins-Minerals (CENTRUM SILVER PO) Take by mouth daily.    Historical Provider, MD  OMEGA-3 KRILL OIL PO Take by mouth daily.    Historical Provider, MD  pravastatin (PRAVACHOL) 40 MG tablet Take 1 tablet (40 mg total) by mouth daily. 03/17/13   Etta Grandchild, MD  Risedronate Sodium 35 MG TBEC  Take 1 tablet (35 mg total) by mouth once a week. 03/17/13   Etta Grandchild, MD  traMADol (ULTRAM) 50 MG tablet Take 1 tablet (50 mg total) by mouth every 6 (six) hours as needed for pain. 03/17/13   Etta Grandchild, MD  zolpidem (AMBIEN) 10 MG tablet Take 1 tablet (10 mg total) by mouth at bedtime as needed. 03/17/13   Etta Grandchild, MD    Family History Family History  Problem Relation Age of Onset  . Arthritis Other   . Hyperlipidemia Other     Social History Social History  Substance Use Topics  . Smoking status: Never Smoker  . Smokeless tobacco: Never Used     Comment: Regular Exercise-No  . Alcohol use No     Allergies   Niacin   Review  of Systems Review of Systems  All other systems reviewed and are negative.    Physical Exam Updated Vital Signs BP (!) 213/78 (BP Location: Right Arm) Comment: RN aware  Pulse 85   Temp 97.9 F (36.6 C) (Oral)   Resp 22   Ht 4' 11.5" (1.511 m)   Wt 140 lb (63.5 kg)   SpO2 95%   BMI 27.80 kg/m   Physical Exam  Constitutional: She is oriented to person, place, and time. She appears well-developed and well-nourished. She appears distressed.  Appears to be in pain  HENT:  Head: Normocephalic and atraumatic.  Mouth/Throat: Oropharynx is clear and moist.  Eyes: Conjunctivae and EOM are normal. Pupils are equal, round, and reactive to light.  Neck: Normal range of motion. Neck supple.  Cardiovascular: Normal rate, regular rhythm and intact distal pulses.   No murmur heard. Pulmonary/Chest: Effort normal and breath sounds normal. No respiratory distress. She has no wheezes. She has no rales.  Abdominal: Soft. She exhibits no distension. There is no tenderness. There is no rebound and no guarding.  Musculoskeletal: She exhibits tenderness and deformity. She exhibits no edema.       Right hip: Normal.       Left hip: She exhibits decreased range of motion, tenderness, bony tenderness and deformity.       Legs: Left 2+ DP pulse and normal sensation in the left foot  Neurological: She is alert and oriented to person, place, and time.  Skin: Skin is warm and dry. No rash noted. No erythema.  Psychiatric: She has a normal mood and affect. Her behavior is normal.  Nursing note and vitals reviewed.    ED Treatments / Results  Labs (all labs ordered are listed, but only abnormal results are displayed) Labs Reviewed  BASIC METABOLIC PANEL - Abnormal; Notable for the following:       Result Value   Glucose, Bld 102 (*)    BUN 35 (*)    Creatinine, Ser 1.67 (*)    GFR calc non Af Amer 27 (*)    GFR calc Af Amer 32 (*)    All other components within normal limits  CBC WITH  DIFFERENTIAL/PLATELET  PROTIME-INR  TYPE AND SCREEN  ABO/RH    EKG  EKG Interpretation  Date/Time:  Tuesday June 20 2016 14:51:42 EST Ventricular Rate:  77 PR Interval:    QRS Duration: 76 QT Interval:  408 QTC Calculation: 462 R Axis:   38 Text Interpretation:  Sinus rhythm Baseline wander in lead(s) V5 Within normal limits No previous tracing Confirmed by Anitra Lauth  MD, Christoher Drudge ([id number]) on 06/20/2016 2:59:09 PM  Radiology Dg Chest 1 View  Result Date: 06/20/2016 CLINICAL DATA:  Acute left hip fracture EXAM: CHEST 1 VIEW COMPARISON:  05/30/2012 FINDINGS: Mild cardiomegaly with central vascular congestion. No focal pneumonia, collapse or consolidation. Negative for edema, effusion or pneumothorax. Trachea is midline. Atherosclerosis noted of the aorta. Degenerative changes noted spine. Advanced arthropathy of both shoulders with humeral head deformities. IMPRESSION: Cardiomegaly with vascular congestion. Negative for CHF or pneumonia Thoracic aortic atherosclerosis Electronically Signed   By: Judie Petit.  Shick M.D.   On: 06/20/2016 14:57   Dg Hip Unilat With Pelvis 2-3 Views Left  Result Date: 06/20/2016 CLINICAL DATA:  Recent fall, left hip injury, acute pain EXAM: DG HIP (WITH OR WITHOUT PELVIS) 2-3V LEFT COMPARISON:  None available FINDINGS: There is an acute left hip subcapital femoral neck fracture with minimal displacement and impaction. No associated hip subluxation or dislocation. Bony pelvis appears intact. No displaced fracture. Bowel artifact overlies the rami. No diastases. Degenerative changes of the lower lumbar spine and SI joints. Right hip appears unremarkable. IMPRESSION: Acute minimally displaced and impacted left subcapital femoral neck fracture. Electronically Signed   By: Judie Petit.  Shick M.D.   On: 06/20/2016 14:50    Procedures Procedures (including critical care time)  Medications Ordered in ED Medications  HYDROmorphone (DILAUDID) injection 0.5 mg (not  administered)  ondansetron (ZOFRAN) injection 4 mg (not administered)  0.9 %  sodium chloride infusion (not administered)     Initial Impression / Assessment and Plan / ED Course  I have reviewed the triage vital signs and the nursing notes.  Pertinent labs & imaging results that were available during my care of the patient were reviewed by me and considered in my medical decision making (see chart for details).    Patient with mechanical fall today and deformity to the left hip. Concern for hip fracture versus dislocation. No prior surgery in this area. Patient does not take anticoagulation and had no head injury or LOC. Hip fracture protocol initiated.  3:15 PM Pt with hip fx.  Pain controlled.  NPO since this morning.  Labs with mild renal disease.  Unknown if this is new.  EKG wnl.  Will discuss with ortho and admit to hospitalist Spoke with Dr. Aundria Rud with ortho who requests [facility] in order to do surgery.  Will plan on surgery tomorrow. Final Clinical Impressions(s) / ED Diagnoses   Final diagnoses:  Left displaced femoral neck fracture [facility])    New Prescriptions New Prescriptions   No medications on file     Gwyneth Sprout, MD 06/20/16 1517    Gwyneth Sprout, MD 06/20/16 1544

## 2016-06-20 NOTE — H&P (Signed)
History and Physical   Cindy Clarke ZOX:[id number] DOB: Jul 18, 1930 DOA: 06/20/2016  Referring MD/NP/PA: Dr. Anitra Lauth, EDP PCP: None Outpatient Specialists: None  Patient coming from: Home  Chief Complaint: Hip pain  HPI: Cindy Clarke is a 83 y.o. female with no regular medical follow up who presented to the ED after a fall.   She was stepping off a curb after shopping and isn't sure how, but misstepped and landed on her left side with immediate pain in her left hip. The pain is severe, nonradiating, constant, waxing and waning, worse with any movement, and only moderate when still. She denies any preceding symptoms including palpitations, chest pain, dyspnea or light-headedness. No LOC or head trauma. She was hypertensive on arrival and in severe pain in the left hip with deformity on exam. XR confirmed left femur fracture. Orthopedics consulted and planning operative intervention. [facility] called to admit.   She denies any significant chest pain or dyspnea on exertion, though she ambulates infrequently due to bilateral knee arthritis. This also limits her to the point that she does not go up or down stairs. Usually gets around with a cane, but has not had any other falls. For this pain she takes aleve, up to 8 tablets per day, and at least 4 tablets every day (2 in AM and 2 in PM) for the past several years.   She had a hysterectomy and denies any issues with anesthesia.  Review of Systems: Per HPI. All others reviewed and are negative.   Past Medical History:  Diagnosis Date  . Anxiety and depression 10/12/2010  . Hyperlipidemia   . Osteoarthritis   . Osteoporosis    Past Surgical History:  Procedure Laterality Date  . ABDOMINAL HYSTERECTOMY     - Nonsmoker who lives alone with 3 children close by. No EtOH or illicit drugs.  Allergies  Allergen Reactions  . Influenza Vaccines Nausea And Vomiting  . Niacin Palpitations   Family History  Problem Relation Age of Onset  .  Arthritis Other   . Hyperlipidemia Other    - Family history otherwise reviewed and not pertinent.  Prior to Admission medications   Medication Sig Start Date End Date Taking? Authorizing Provider  Ascorbic Acid (VITAMIN C PO) Take 2 tablets by mouth 2 (two) times daily.   Yes Historical Provider, MD  CALCIUM PO Take 1 tablet by mouth 2 (two) times daily.   Yes Historical Provider, MD  Cholecalciferol (VITAMIN D PO) Take 2 tablets by mouth at bedtime.    Yes Historical Provider, MD  Misc Natural Products (OSTEO BI-FLEX ADV JOINT SHIELD PO) Take 1 tablet by mouth 2 (two) times daily.    Yes Historical Provider, MD  Multiple Vitamins-Minerals (CENTRUM SILVER PO) Take 1 tablet by mouth daily.    Yes Historical Provider, MD  naproxen sodium (ANAPROX) 220 MG tablet Take 440 mg by mouth 3 (three) times daily as needed (for pain).   Yes Historical Provider, MD  pravastatin (PRAVACHOL) 40 MG tablet Take 1 tablet (40 mg total) by mouth daily. Patient not taking: Reported on 06/20/2016 03/17/13   Etta Grandchild, MD  Risedronate Sodium 35 MG TBEC Take 1 tablet (35 mg total) by mouth once a week. Patient not taking: Reported on 06/20/2016 03/17/13   Etta Grandchild, MD  zolpidem (AMBIEN) 10 MG tablet Take 1 tablet (10 mg total) by mouth at bedtime as needed. Patient not taking: Reported on 06/20/2016 03/17/13   Etta Grandchild, MD    Physical  Exam: Vitals:   06/20/16 1346 06/20/16 1500 06/20/16 1545 06/20/16 1630  BP: (!) 213/78 195/76 195/67 183/71  Pulse: 85 72 83 92  Resp: 22 14 13 14   Temp: 97.9 F (36.6 C)     TempSrc: Oral     SpO2: 95% 95% (!) 89% 96%  Weight: 63.5 kg (140 lb)     Height: 4' 11.5" (1.511 m)      Constitutional: Pleasant, elderly female in no distress, calm demeanor Eyes: Lids and conjunctivae normal, PERRL ENMT: Mucous membranes are moist. Posterior pharynx clear of any exudate or lesions. Fair dentition.  Neck: normal, supple, no masses, no thyromegaly Respiratory:  Non-labored breathing without accessory muscle use. Clear breath sounds to auscultation bilaterally Cardiovascular: Regular rate and rhythm, no murmurs, rubs, or gallops. No carotid bruits. No JVD. No LE edema. 2+ pedal pulses. Abdomen: Normoactive bowel sounds. No tenderness, non-distended, and no masses palpated. No hepatosplenomegaly. GU: No indwelling catheter Musculoskeletal: No clubbing / cyanosis. LLE with hip deformity, compartment soft, no skin tenting. Distal motor function and sensation in tact and cap refill brisk throughout.  Skin: Warm, dry. No rashes, wounds, no ulcers. No significant lesions noted.  Neurologic: CN II-XII grossly intact. Gait not assessed. Speech normal. No focal deficits in motor strength or sensation in all extremities.  Psychiatric: Alert and oriented x3. Normal judgment and insight. Mood euthymic with congruent affect.   Labs on Admission: I have personally reviewed following labs and imaging studies  CBC:  Recent Labs Lab 06/20/16 1400  WBC 10.2  NEUTROABS 7.7  HGB 14.5  HCT 41.7  MCV 88.3  PLT 328   Basic Metabolic Panel:  Recent Labs Lab 06/20/16 1400  NA 140  K 4.0  CL 105  CO2 24  GLUCOSE 102*  BUN 35*  CREATININE 1.67*  CALCIUM 9.8   GFR: Estimated Creatinine Clearance: 21 mL/min (by C-G formula based on SCr of 1.67 mg/dL (H)).  Coagulation Profile:  Recent Labs Lab 06/20/16 1440  INR 1.01   Radiological Exams on Admission: Dg Chest 1 View  Result Date: 06/20/2016 CLINICAL DATA:  Acute left hip fracture EXAM: CHEST 1 VIEW COMPARISON:  05/30/2012 FINDINGS: Mild cardiomegaly with central vascular congestion. No focal pneumonia, collapse or consolidation. Negative for edema, effusion or pneumothorax. Trachea is midline. Atherosclerosis noted of the aorta. Degenerative changes noted spine. Advanced arthropathy of both shoulders with humeral head deformities. IMPRESSION: Cardiomegaly with vascular congestion. Negative for CHF or  pneumonia Thoracic aortic atherosclerosis Electronically Signed   By: Judie Petit.  Shick M.D.   On: 06/20/2016 14:57   Dg Hip Unilat With Pelvis 2-3 Views Left  Result Date: 06/20/2016 CLINICAL DATA:  Recent fall, left hip injury, acute pain EXAM: DG HIP (WITH OR WITHOUT PELVIS) 2-3V LEFT COMPARISON:  None available FINDINGS: There is an acute left hip subcapital femoral neck fracture with minimal displacement and impaction. No associated hip subluxation or dislocation. Bony pelvis appears intact. No displaced fracture. Bowel artifact overlies the rami. No diastases. Degenerative changes of the lower lumbar spine and SI joints. Right hip appears unremarkable. IMPRESSION: Acute minimally displaced and impacted left subcapital femoral neck fracture. Electronically Signed   By: Judie Petit.  Shick M.D.   On: 06/20/2016 14:50   EKG: Independently reviewed. NSR, vent rate 77bpm, normal axis and intervals. ST/T wnl.   Assessment/Plan Principal Problem:   Femoral neck fracture (HCC) Active Problems:   Osteoarthritis   Creatinine elevation   HTN (hypertension)   Left subcapital femoral neck fracture: s/p mechanical  fall.  - Operative management planned per Dr. Aundria Rud 1/31. He would prefer transfer to [facility] though no beds available and 20 pt's in ED awaiting placement there, so will admit to [facility].  - Pain control: dilaudid 0.5mg  q2h prn. - ECG ok, no chest pain, and fair functional status.  - PT/OT/SW/CM consulted  Creatinine elevation: Cr 1.67, so CrCl ~35ml/min. Unknown baseline, though suspect some element of CKD with HTN here and history of significant NSAID use. - IVF's - Trend BMP - Avoid nephrotoxins including avoidance of NSAIDs  HTN: Expect chronic and exacerbated by pain. CXR shows cardiomegaly with vascular congestion without overt pulmonary edema.  - No IVF while still taking po. Surgery planned for tomorrow evening, so will make NPO after breakfast tomorrow. - Check echocardiogram  DVT prophylaxis:  Heparin overnight Code Status: Full  Family Communication: None at bedside Disposition Plan: Uncertain Consults called: Orthopedics by EDP  Admission status: Inpatient    Hazeline Junker, MD [facility] Pager [phone]  If 7PM-7AM, please contact night-coverage [email or web address] Password [facility] 06/20/2016, 5:49 PM

## 2016-06-21 ENCOUNTER — Inpatient Hospital Stay (HOSPITAL_COMMUNITY): Payer: Medicare HMO

## 2016-06-21 DIAGNOSIS — I517 Cardiomegaly: Secondary | ICD-10-CM

## 2016-06-21 LAB — CBC
HEMATOCRIT: 38.5 % (ref 36.0–46.0)
Hemoglobin: 12.9 g/dL (ref 12.0–15.0)
MCH: 30.1 pg (ref 26.0–34.0)
MCHC: 33.5 g/dL (ref 30.0–36.0)
MCV: 90 fL (ref 78.0–100.0)
PLATELETS: 318 10*3/uL (ref 150–400)
RBC: 4.28 MIL/uL (ref 3.87–5.11)
RDW: 13 % (ref 11.5–15.5)
WBC: 12 10*3/uL — ABNORMAL HIGH (ref 4.0–10.5)

## 2016-06-21 LAB — BASIC METABOLIC PANEL
Anion gap: 10 (ref 5–15)
BUN: 36 mg/dL — AB (ref 6–20)
CO2: 25 mmol/L (ref 22–32)
CREATININE: 1.5 mg/dL — AB (ref 0.44–1.00)
Calcium: 9.2 mg/dL (ref 8.9–10.3)
Chloride: 104 mmol/L (ref 101–111)
GFR calc Af Amer: 36 mL/min — ABNORMAL LOW (ref >60)
GFR calc non Af Amer: 31 mL/min — ABNORMAL LOW (ref >60)
Glucose, Bld: 118 mg/dL — ABNORMAL HIGH (ref 65–99)
POTASSIUM: 3.6 mmol/L (ref 3.5–5.1)
Sodium: 139 mmol/L (ref 135–145)

## 2016-06-21 LAB — ECHOCARDIOGRAM COMPLETE
Height: 59.5 in
Weight: 2240 oz

## 2016-06-21 LAB — SURGICAL PCR SCREEN
MRSA, PCR: NEGATIVE
Staphylococcus aureus: POSITIVE — AB

## 2016-06-21 MED ORDER — POVIDONE-IODINE 10 % EX SWAB
2.0000 "application " | Freq: Once | CUTANEOUS | Status: AC
  Administered 2016-06-22: 2 via TOPICAL

## 2016-06-21 MED ORDER — CEFAZOLIN SODIUM-DEXTROSE 2-4 GM/100ML-% IV SOLN
2.0000 g | INTRAVENOUS | Status: AC
  Administered 2016-06-22: 2 g via INTRAVENOUS
  Filled 2016-06-21 (×2): qty 100

## 2016-06-21 MED ORDER — MUPIROCIN 2 % EX OINT
1.0000 "application " | TOPICAL_OINTMENT | Freq: Two times a day (BID) | CUTANEOUS | Status: AC
  Administered 2016-06-21 – 2016-06-25 (×10): 1 via NASAL
  Filled 2016-06-21: qty 22

## 2016-06-21 MED ORDER — CHLORHEXIDINE GLUCONATE 4 % EX LIQD
60.0000 mL | Freq: Once | CUTANEOUS | Status: AC
  Administered 2016-06-22: 4 via TOPICAL
  Filled 2016-06-21: qty 60

## 2016-06-21 MED ORDER — METHOCARBAMOL 1000 MG/10ML IJ SOLN
500.0000 mg | Freq: Three times a day (TID) | INTRAVENOUS | Status: DC | PRN
  Administered 2016-06-21 – 2016-06-22 (×3): 500 mg via INTRAVENOUS
  Filled 2016-06-21 (×4): qty 5
  Filled 2016-06-21: qty 550
  Filled 2016-06-21: qty 5

## 2016-06-21 MED ORDER — POLYETHYLENE GLYCOL 3350 17 G PO PACK
17.0000 g | PACK | Freq: Every day | ORAL | Status: DC
  Administered 2016-06-21 – 2016-06-29 (×8): 17 g via ORAL
  Filled 2016-06-21 (×8): qty 1

## 2016-06-21 MED ORDER — AMLODIPINE BESYLATE 5 MG PO TABS
5.0000 mg | ORAL_TABLET | Freq: Every day | ORAL | Status: DC
  Administered 2016-06-21 – 2016-06-23 (×3): 5 mg via ORAL
  Filled 2016-06-21 (×3): qty 1

## 2016-06-21 MED ORDER — METHOCARBAMOL 1000 MG/10ML IJ SOLN
500.0000 mg | Freq: Once | INTRAVENOUS | Status: AC
  Administered 2016-06-21: 500 mg via INTRAVENOUS
  Filled 2016-06-21: qty 550

## 2016-06-21 MED ORDER — CHLORHEXIDINE GLUCONATE CLOTH 2 % EX PADS
6.0000 | MEDICATED_PAD | Freq: Every day | CUTANEOUS | Status: AC
  Administered 2016-06-22 – 2016-06-25 (×4): 6 via TOPICAL

## 2016-06-21 MED ORDER — SODIUM CHLORIDE 0.9 % IV SOLN
INTRAVENOUS | Status: DC
  Administered 2016-06-21 – 2016-06-24 (×5): via INTRAVENOUS

## 2016-06-21 MED ORDER — HEPARIN SODIUM (PORCINE) 5000 UNIT/ML IJ SOLN
5000.0000 [IU] | Freq: Three times a day (TID) | INTRAMUSCULAR | Status: DC
  Administered 2016-06-21 (×2): 5000 [IU] via SUBCUTANEOUS
  Filled 2016-06-21: qty 1

## 2016-06-21 NOTE — Progress Notes (Signed)
PT Cancellation Note  Patient Details Name: Cindy Clarke MRN: [id number] DOB: [birth year]   Cancelled Treatment:     PT order received but eval deferred - pt with femoral neck fx and awaiting surgery.  Please reorder PT services post op.   Carletta Feasel 06/21/2016, 7:46 AM

## 2016-06-21 NOTE — Progress Notes (Signed)
  Echocardiogram 2D Echocardiogram has been performed.  Cindy Clarke M 06/21/2016, 8:45 AM

## 2016-06-21 NOTE — Care Management Note (Signed)
Case Management Note  Patient Details  Name: Cindy Clarke MRN: [id number] Date of Birth: Sep 20, 1930  Subjective/Objective:                    Action/Plan:  Will await post op PT/OT evals Expected Discharge Date:   (unknown)               Expected Discharge Plan:     In-House Referral:     Discharge planning Services  CM Consult  Post Acute Care Choice:  Home Health, Durable Medical Equipment Choice offered to:     DME Arranged:    DME Agency:     HH Arranged:    HH Agency:     Status of Service:  In process, will continue to follow  If discussed at Long Length of Stay Meetings, dates discussed:    Additional Comments:  Kingsley Plan, RN 06/21/2016, 2:35 PM

## 2016-06-21 NOTE — Progress Notes (Signed)
1540, Cindy Clarke 85yoF  Hip Fx no surgery yet. Screaming d/t muscle cramp and tensing. Robaxin really helped 0100.  Can she get another. Thx

## 2016-06-21 NOTE — Progress Notes (Signed)
1540, [place], [place] 85yoF. With Hip fx. No surgery yet. Feels she's tensed up can't relax.  Can she get a robaxin or a muscle relaxer.  She's wincing and crying. Receiving .5 of Dilaudid.  Thanks.  Kyana Aicher W.

## 2016-06-21 NOTE — Progress Notes (Signed)
OT Cancellation Note  Patient Details Name: Cindy Clarke MRN: [id number] DOB: [birth year]   Cancelled Treatment:    Reason Eval/Treat Not Completed: Other (comment). Pt is awaiting sx.    Ainslee Sou 06/21/2016, 7:45 AM  Marica Otter, OTR/L [phone] 06/21/2016

## 2016-06-21 NOTE — Progress Notes (Addendum)
Dr Sunnie Nielsen ordered patient transfer to [facility] today.  Report called to [facility] [facility].  [facility] Fus) notified of change of plans.  Transport cancelled for tomorrow and arranged for today

## 2016-06-21 NOTE — Progress Notes (Signed)
Patient transferred to [facility] room1 from [facility] via [place]. Alert and oriented. Complains of left leg pain with movement. Oriented to room. Call bell in reach. Positioned for comfort.

## 2016-06-21 NOTE — Progress Notes (Signed)
PROGRESS NOTE    Cindy Clarke  ZOX:[id number] DOB: 10/30/1930 DOA: 06/20/2016 PCP: No primary care provider on file.    Brief Narrative: Cindy Clarke is a 83 y.o. female with no regular medical follow up who presented to the ED after a fall.   She was stepping off a curb after shopping and isn't sure how, but misstepped and landed on her left side with immediate pain in her left hip. The pain is severe, nonradiating, constant, waxing and waning, worse with any movement, and only moderate when still. She denies any preceding symptoms including palpitations, chest pain, dyspnea or light-headedness. No LOC or head trauma. She was hypertensive on arrival and in severe pain in the left hip with deformity on exam. XR confirmed left femur fracture. Orthopedics consulted and planning operative intervention. [facility]H called to admit.    Assessment & Plan:   Principal Problem:   Femoral neck fracture (HCC) Active Problems:   Osteoarthritis   Creatinine elevation   HTN (hypertension)  Left subcapital femoral neck fracture: In setting of mechanical fall.  Denies dyspnea , chest pain, ECHO with Normal EF.  [facility], will transfer patient today.  Pain management, and bowel regimen.   Probably AKI on CKD, unknown  baseline.  Cr on admission at 1.6. It has decreased to 1.5.  IV fluids.  Repeat labs in am.   HTN; patient was not taking medications for HTN.  Will start Norvasc.  PRN Hydralazine.    DVT prophylaxis: heparin.  Code Status: Full code.  Family Communication: care discussed with patient.  Disposition Plan: remain inpatient for Sx. Transfer to [facility].    Consultants:   Dr Aundria Rud.   Procedures: ECHO; - Left ventricle: The cavity size was normal. Wall thickness was   increased in a pattern of mild LVH. Systolic function was normal.   The estimated ejection fraction was in the range of 60% to 65%.   Wall motion was normal; there were no  regional wall motion   abnormalities. Doppler parameters are consistent with abnormal   left ventricular relaxation (grade 1 diastolic dysfunction). - Aortic valve: Mildly calcified annulus. - Pulmonary arteries: Systolic pressure was moderately increased.    PA peak pressure: 46 mm Hg (S).    Antimicrobials: none   Subjective: She is  not aware of history of HTN.  Denies chest pain or dyspnea.   Objective: Vitals:   06/20/16 2145 06/21/16 0126 06/21/16 0545 06/21/16 1045  BP: (!) 168/73 139/79 (!) 182/67 (!) 180/60  Pulse: 76 79 77 85  Resp: 16 16 16 16   Temp: 98 F (36.7 C) 98 F (36.7 C) 98.6 F (37 C) 98.8 F (37.1 C)  TempSrc: Oral Oral Oral Oral  SpO2: 95% 95% 96% 99%  Weight:      Height:        Intake/Output Summary (Last 24 hours) at 06/21/16 1125 Last data filed at 06/21/16 1047  Gross per 24 hour  Intake             2580 ml  Output             2800 ml  Net             -220 ml   Filed Weights   06/20/16 1346  Weight: 63.5 kg (140 lb)    Examination:  General exam: Appears calm and comfortable  Respiratory system: Clear to auscultation. Respiratory effort normal. Cardiovascular system: S1 & S2 heard, RRR.  No JVD, murmurs, rubs, gallops or clicks. No pedal edema. Gastrointestinal system: Abdomen is nondistended, soft and nontender. No organomegaly or masses felt. Normal bowel sounds heard. Central nervous system: Alert and oriented. No focal neurological deficits. Extremities: Symmetric 5 x 5 power. Left hip with deformity  Skin: No rashes, lesions or ulcers     Data Reviewed: I have personally reviewed following labs and imaging studies  CBC:  Recent Labs Lab 06/20/16 1400 06/21/16 0445  WBC 10.2 12.0*  NEUTROABS 7.7  --   HGB 14.5 12.9  HCT 41.7 38.5  MCV 88.3 90.0  PLT 328 318   Basic Metabolic Panel:  Recent Labs Lab 06/20/16 1400 06/21/16 0445  NA 140 139  K 4.0 3.6  CL 105 104  CO2 24 25  GLUCOSE 102* 118*  BUN 35* 36*   CREATININE 1.67* 1.50*  CALCIUM 9.8 9.2   GFR: Estimated Creatinine Clearance: 23.3 mL/min (by C-G formula based on SCr of 1.5 mg/dL (H)). Liver Function Tests: No results for input(s): AST, ALT, ALKPHOS, BILITOT, PROT, ALBUMIN in the last 168 hours. No results for input(s): LIPASE, AMYLASE in the last 168 hours. No results for input(s): AMMONIA in the last 168 hours. Coagulation Profile:  Recent Labs Lab 06/20/16 1440  INR 1.01   Cardiac Enzymes: No results for input(s): CKTOTAL, CKMB, CKMBINDEX, TROPONINI in the last 168 hours. BNP (last 3 results) No results for input(s): PROBNP in the last 8760 hours. HbA1C: No results for input(s): HGBA1C in the last 72 hours. CBG: No results for input(s): GLUCAP in the last 168 hours. Lipid Profile: No results for input(s): CHOL, HDL, LDLCALC, TRIG, CHOLHDL, LDLDIRECT in the last 72 hours. Thyroid Function Tests: No results for input(s): TSH, T4TOTAL, FREET4, T3FREE, THYROIDAB in the last 72 hours. Anemia Panel: No results for input(s): VITAMINB12, FOLATE, FERRITIN, TIBC, IRON, RETICCTPCT in the last 72 hours. Sepsis Labs: No results for input(s): PROCALCITON, LATICACIDVEN in the last 168 hours.  Recent Results (from the past 240 hour(s))  Surgical pcr screen     Status: Abnormal   Collection Time: 06/21/16  5:00 AM  Result Value Ref Range Status   MRSA, PCR NEGATIVE NEGATIVE Final   Staphylococcus aureus POSITIVE (A) NEGATIVE Final    Comment:        The Xpert SA Assay (FDA approved for NASAL specimens in patients over 9 years of age), is one component of a comprehensive surveillance program.  Test performance has been validated by [facility] for patients greater than or equal to 19 year old. It is not intended to diagnose infection nor to guide or monitor treatment.          Radiology Studies: Dg Chest 1 View  Result Date: 06/20/2016 CLINICAL DATA:  Acute left hip fracture EXAM: CHEST 1 VIEW COMPARISON:   05/30/2012 FINDINGS: Mild cardiomegaly with central vascular congestion. No focal pneumonia, collapse or consolidation. Negative for edema, effusion or pneumothorax. Trachea is midline. Atherosclerosis noted of the aorta. Degenerative changes noted spine. Advanced arthropathy of both shoulders with humeral head deformities. IMPRESSION: Cardiomegaly with vascular congestion. Negative for CHF or pneumonia Thoracic aortic atherosclerosis Electronically Signed   By: Judie Petit.  Shick M.D.   On: 06/20/2016 14:57   Dg Hip Unilat With Pelvis 2-3 Views Left  Result Date: 06/20/2016 CLINICAL DATA:  Recent fall, left hip injury, acute pain EXAM: DG HIP (WITH OR WITHOUT PELVIS) 2-3V LEFT COMPARISON:  None available FINDINGS: There is an acute left hip subcapital femoral neck fracture with  minimal displacement and impaction. No associated hip subluxation or dislocation. Bony pelvis appears intact. No displaced fracture. Bowel artifact overlies the rami. No diastases. Degenerative changes of the lower lumbar spine and SI joints. Right hip appears unremarkable. IMPRESSION: Acute minimally displaced and impacted left subcapital femoral neck fracture. Electronically Signed   By: Judie Petit.  Shick M.D.   On: 06/20/2016 14:50        Scheduled Meds: . heparin  5,000 Units Subcutaneous Q8H  . senna-docusate  1 tablet Oral Daily   Continuous Infusions:   LOS: 1 day    Time spent: 35 minutes.     Alba Cory, MD [facility] Pager [phone]  If 7PM-7AM, please contact night-coverage [email or web address] Password [email or web address] 06/21/2016, 11:25 AM

## 2016-06-21 NOTE — Progress Notes (Signed)
Spoke with Cindy Clarke at [facility] about transport to [facility] for femoral neck fracture repair with Duwayne Heck MD.

## 2016-06-21 NOTE — Progress Notes (Signed)
ANTICOAGULATION CONSULT NOTE  Pharmacy Consult for heparin Indication: VTE prophylaxis   Assessment: 52 yof admitted s/p fall. Ortho planning intervention for L femur fracture tomorrow. Pharmacy consulted to dose heparin for VTE prophylaxis. Not on anticoagulation PTA. CBC wnl, no bleed documented.  Goal of Therapy:  VTE prevention Monitor platelets by anticoagulation protocol: Yes   Plan:  Heparin 5000 units [place] q8h Monitor CBC, s/sx bleeding Pharmacy will s/o consult - not renally adjusted   Babs Bertin, PharmD, BCPS Clinical Pharmacist 06/21/2016 2:49 PM

## 2016-06-22 ENCOUNTER — Encounter (HOSPITAL_COMMUNITY)
Admission: EM | Disposition: A | Discharge status: Skilled Nursing Facility | Payer: Self-pay | Source: Home / Self Care | Attending: Internal Medicine

## 2016-06-22 ENCOUNTER — Inpatient Hospital Stay (HOSPITAL_COMMUNITY): Payer: Medicare HMO | Admitting: Anesthesiology

## 2016-06-22 ENCOUNTER — Inpatient Hospital Stay (HOSPITAL_COMMUNITY): Payer: Medicare HMO

## 2016-06-22 ENCOUNTER — Encounter (HOSPITAL_COMMUNITY): Payer: Self-pay | Admitting: Anesthesiology

## 2016-06-22 ENCOUNTER — Inpatient Hospital Stay (HOSPITAL_COMMUNITY): Admission: RE | Admit: 2016-06-22 | Payer: Medicare PPO | Source: Ambulatory Visit | Admitting: Orthopedic Surgery

## 2016-06-22 DIAGNOSIS — I5189 Other ill-defined heart diseases: Secondary | ICD-10-CM

## 2016-06-22 DIAGNOSIS — S72002P Fracture of unspecified part of neck of left femur, subsequent encounter for closed fracture with malunion: Secondary | ICD-10-CM

## 2016-06-22 DIAGNOSIS — D72829 Elevated white blood cell count, unspecified: Secondary | ICD-10-CM

## 2016-06-22 HISTORY — PX: HIP ARTHROPLASTY: SHX981

## 2016-06-22 LAB — CBC
HCT: 39.9 % (ref 36.0–46.0)
HEMOGLOBIN: 13.2 g/dL (ref 12.0–15.0)
MCH: 30.1 pg (ref 26.0–34.0)
MCHC: 33.1 g/dL (ref 30.0–36.0)
MCV: 91.1 fL (ref 78.0–100.0)
Platelets: 306 10*3/uL (ref 150–400)
RBC: 4.38 MIL/uL (ref 3.87–5.11)
RDW: 13 % (ref 11.5–15.5)
WBC: 11.2 10*3/uL — AB (ref 4.0–10.5)

## 2016-06-22 LAB — BASIC METABOLIC PANEL
ANION GAP: 10 (ref 5–15)
BUN: 24 mg/dL — ABNORMAL HIGH (ref 6–20)
CHLORIDE: 105 mmol/L (ref 101–111)
CO2: 22 mmol/L (ref 22–32)
Calcium: 8.8 mg/dL — ABNORMAL LOW (ref 8.9–10.3)
Creatinine, Ser: 1.32 mg/dL — ABNORMAL HIGH (ref 0.44–1.00)
GFR calc Af Amer: 42 mL/min — ABNORMAL LOW (ref >60)
GFR, EST NON AFRICAN AMERICAN: 36 mL/min — AB (ref >60)
GLUCOSE: 102 mg/dL — AB (ref 65–99)
Potassium: 3.6 mmol/L (ref 3.5–5.1)
Sodium: 137 mmol/L (ref 135–145)

## 2016-06-22 LAB — ABO/RH: ABO/RH(D): O NEG

## 2016-06-22 LAB — TYPE AND SCREEN
ABO/RH(D): O NEG
Antibody Screen: NEGATIVE

## 2016-06-22 SURGERY — HEMIARTHROPLASTY, HIP, DIRECT ANTERIOR APPROACH, FOR FRACTURE
Anesthesia: General | Laterality: Left

## 2016-06-22 MED ORDER — FENTANYL CITRATE (PF) 100 MCG/2ML IJ SOLN
INTRAMUSCULAR | Status: AC
  Filled 2016-06-22: qty 2

## 2016-06-22 MED ORDER — PHENYLEPHRINE HCL 10 MG/ML IJ SOLN
INTRAMUSCULAR | Status: DC | PRN
  Administered 2016-06-22: 25 ug/min via INTRAVENOUS

## 2016-06-22 MED ORDER — ONDANSETRON HCL 4 MG/2ML IJ SOLN: 4.0000 mg | Freq: Four times a day (QID) | INTRAMUSCULAR | Status: DC | PRN

## 2016-06-22 MED ORDER — PROPOFOL 10 MG/ML IV BOLUS
INTRAVENOUS | Status: DC | PRN
  Administered 2016-06-22: 100 mg via INTRAVENOUS

## 2016-06-22 MED ORDER — PROMETHAZINE HCL 25 MG/ML IJ SOLN: 6.2500 mg | INTRAMUSCULAR | Status: DC | PRN

## 2016-06-22 MED ORDER — EPHEDRINE SULFATE 50 MG/ML IJ SOLN
INTRAMUSCULAR | Status: DC | PRN
  Administered 2016-06-22: 10 mg via INTRAVENOUS

## 2016-06-22 MED ORDER — ACETAMINOPHEN 650 MG RE SUPP: 650.0000 mg | Freq: Four times a day (QID) | RECTAL | Status: DC | PRN

## 2016-06-22 MED ORDER — BUPIVACAINE HCL (PF) 0.25 % IJ SOLN
INTRAMUSCULAR | Status: AC
  Filled 2016-06-22: qty 30

## 2016-06-22 MED ORDER — LACTATED RINGERS IV SOLN
INTRAVENOUS | Status: DC
  Administered 2016-06-22: 14:00:00 via INTRAVENOUS

## 2016-06-22 MED ORDER — ACETAMINOPHEN 325 MG PO TABS: 650.0000 mg | ORAL_TABLET | Freq: Four times a day (QID) | ORAL | Status: DC | PRN

## 2016-06-22 MED ORDER — DEXAMETHASONE SODIUM PHOSPHATE 10 MG/ML IJ SOLN
INTRAMUSCULAR | Status: AC
  Filled 2016-06-22: qty 1

## 2016-06-22 MED ORDER — LIDOCAINE HCL (CARDIAC) 20 MG/ML IV SOLN
INTRAVENOUS | Status: DC | PRN
  Administered 2016-06-22: 50 mg via INTRAVENOUS

## 2016-06-22 MED ORDER — FENTANYL CITRATE (PF) 100 MCG/2ML IJ SOLN
50.0000 ug | Freq: Once | INTRAMUSCULAR | Status: AC
  Administered 2016-06-22: 50 ug via INTRAVENOUS

## 2016-06-22 MED ORDER — ROCURONIUM BROMIDE 100 MG/10ML IV SOLN
INTRAVENOUS | Status: DC | PRN
  Administered 2016-06-22: 30 mg via INTRAVENOUS
  Administered 2016-06-22: 10 mg via INTRAVENOUS

## 2016-06-22 MED ORDER — METOCLOPRAMIDE HCL 5 MG PO TABS: 5.0000 mg | ORAL_TABLET | Freq: Three times a day (TID) | ORAL | Status: DC | PRN

## 2016-06-22 MED ORDER — HYDROMORPHONE HCL 1 MG/ML IJ SOLN
INTRAMUSCULAR | Status: AC
  Filled 2016-06-22: qty 0.5

## 2016-06-22 MED ORDER — LORATADINE 10 MG PO TABS
10.0000 mg | ORAL_TABLET | Freq: Every day | ORAL | Status: DC | PRN
  Administered 2016-06-22: 10 mg via ORAL
  Filled 2016-06-22 (×2): qty 1

## 2016-06-22 MED ORDER — SODIUM CHLORIDE 0.9 % IR SOLN
Status: DC | PRN
  Administered 2016-06-22: 1000 mL

## 2016-06-22 MED ORDER — LACTATED RINGERS IV SOLN
INTRAVENOUS | Status: DC | PRN
  Administered 2016-06-22 (×2): via INTRAVENOUS

## 2016-06-22 MED ORDER — ALBUMIN HUMAN 5 % IV SOLN
INTRAVENOUS | Status: DC | PRN
Start: 2016-06-22 — End: 2016-06-22
  Administered 2016-06-22: 16:00:00 via INTRAVENOUS

## 2016-06-22 MED ORDER — HYDROMORPHONE HCL 1 MG/ML IJ SOLN
0.2500 mg | INTRAMUSCULAR | Status: DC | PRN
Start: 2016-06-22 — End: 2016-06-22
  Administered 2016-06-22 (×2): 0.5 mg via INTRAVENOUS

## 2016-06-22 MED ORDER — METHOCARBAMOL 500 MG PO TABS
500.0000 mg | ORAL_TABLET | Freq: Four times a day (QID) | ORAL | Status: DC | PRN
  Administered 2016-06-22 – 2016-06-29 (×20): 500 mg via ORAL
  Filled 2016-06-22 (×22): qty 1

## 2016-06-22 MED ORDER — FENTANYL CITRATE (PF) 100 MCG/2ML IJ SOLN
INTRAMUSCULAR | Status: DC | PRN
  Administered 2016-06-22: 100 ug via INTRAVENOUS
  Administered 2016-06-22 (×3): 50 ug via INTRAVENOUS

## 2016-06-22 MED ORDER — PROPOFOL 10 MG/ML IV BOLUS
INTRAVENOUS | Status: AC
  Filled 2016-06-22: qty 20

## 2016-06-22 MED ORDER — OXYCODONE HCL 5 MG PO TABS: 5.0000 mg | ORAL_TABLET | ORAL | 0 refills | Status: DC | PRN

## 2016-06-22 MED ORDER — ONDANSETRON HCL 4 MG/2ML IJ SOLN
INTRAMUSCULAR | Status: DC | PRN
  Administered 2016-06-22: 4 mg via INTRAVENOUS

## 2016-06-22 MED ORDER — GUAIFENESIN ER 600 MG PO TB12: 600.0000 mg | ORAL_TABLET | Freq: Two times a day (BID) | ORAL | Status: DC | PRN

## 2016-06-22 MED ORDER — ONDANSETRON HCL 4 MG PO TABS: 4.0000 mg | ORAL_TABLET | Freq: Four times a day (QID) | ORAL | Status: DC | PRN

## 2016-06-22 MED ORDER — FENTANYL CITRATE (PF) 100 MCG/2ML IJ SOLN
INTRAMUSCULAR | Status: AC
  Administered 2016-06-22: 50 ug via INTRAVENOUS
  Filled 2016-06-22: qty 2

## 2016-06-22 MED ORDER — ENOXAPARIN SODIUM 30 MG/0.3ML ~~LOC~~ SOLN
30.0000 mg | SUBCUTANEOUS | Status: DC
  Administered 2016-06-23 – 2016-06-29 (×7): 30 mg via SUBCUTANEOUS
  Filled 2016-06-22 (×7): qty 0.3

## 2016-06-22 MED ORDER — MEPERIDINE HCL 25 MG/ML IJ SOLN: 6.2500 mg | INTRAMUSCULAR | Status: DC | PRN

## 2016-06-22 MED ORDER — BUPIVACAINE HCL 0.25 % IJ SOLN
INTRAMUSCULAR | Status: DC | PRN
  Administered 2016-06-22: 30 mL

## 2016-06-22 MED ORDER — CEFAZOLIN SODIUM-DEXTROSE 2-4 GM/100ML-% IV SOLN
2.0000 g | Freq: Two times a day (BID) | INTRAVENOUS | Status: AC
  Administered 2016-06-23 (×2): 2 g via INTRAVENOUS
  Filled 2016-06-22 (×2): qty 100

## 2016-06-22 MED ORDER — METOCLOPRAMIDE HCL 5 MG/ML IJ SOLN: 5.0000 mg | Freq: Three times a day (TID) | INTRAMUSCULAR | Status: DC | PRN

## 2016-06-22 MED ORDER — SUGAMMADEX SODIUM 200 MG/2ML IV SOLN
INTRAVENOUS | Status: DC | PRN
  Administered 2016-06-22: 200 mg via INTRAVENOUS

## 2016-06-22 MED ORDER — HYDROMORPHONE HCL 1 MG/ML IJ SOLN
INTRAMUSCULAR | Status: AC
Start: 2016-06-22 — End: 2016-06-22
  Administered 2016-06-22: 0.5 mg via INTRAVENOUS
  Filled 2016-06-22: qty 1

## 2016-06-22 MED ORDER — ASPIRIN EC 81 MG PO TBEC: 81.0000 mg | DELAYED_RELEASE_TABLET | Freq: Two times a day (BID) | ORAL | 0 refills | Status: AC

## 2016-06-22 MED ORDER — METHOCARBAMOL 1000 MG/10ML IJ SOLN
500.0000 mg | Freq: Four times a day (QID) | INTRAVENOUS | Status: DC | PRN
  Filled 2016-06-22 (×2): qty 5

## 2016-06-22 SURGICAL SUPPLY — 41 items
BLADE SAGITTAL (BLADE) ×3
BLADE SAW THK.89X75X18XSGTL (BLADE) ×1 IMPLANT
CAPT HIP HEMI 2 ×3 IMPLANT
COVER SURGICAL LIGHT HANDLE (MISCELLANEOUS) ×3 IMPLANT
DRAPE HIP W/POCKET STRL (DRAPE) ×3 IMPLANT
DRAPE INCISE IOBAN 85X60 (DRAPES) ×3 IMPLANT
DRAPE U-SHAPE 47X51 STRL (DRAPES) ×6 IMPLANT
DRESSING AQUACEL AG SP 3.5X10 (GAUZE/BANDAGES/DRESSINGS) ×1 IMPLANT
DRSG AQUACEL AG ADV 3.5X10 (GAUZE/BANDAGES/DRESSINGS) ×3 IMPLANT
DRSG AQUACEL AG SP 3.5X10 (GAUZE/BANDAGES/DRESSINGS) ×3
DURAPREP 26ML APPLICATOR (WOUND CARE) ×6 IMPLANT
ELECT BLADE 4.0 EZ CLEAN MEGAD (MISCELLANEOUS)
ELECT CAUTERY BLADE 6.4 (BLADE) ×3 IMPLANT
ELECT REM PT RETURN 9FT ADLT (ELECTROSURGICAL) ×3
ELECTRODE BLDE 4.0 EZ CLN MEGD (MISCELLANEOUS) IMPLANT
ELECTRODE REM PT RTRN 9FT ADLT (ELECTROSURGICAL) ×1 IMPLANT
FACESHIELD WRAPAROUND (MASK) ×6 IMPLANT
FACESHIELD WRAPAROUND OR TEAM (MASK) ×2 IMPLANT
GLOVE BIO SURGEON STRL SZ7.5 (GLOVE) ×3 IMPLANT
GLOVE BIOGEL PI IND STRL 8 (GLOVE) ×1 IMPLANT
GLOVE BIOGEL PI INDICATOR 8 (GLOVE) ×2
GOWN STRL REUS W/ TWL LRG LVL3 (GOWN DISPOSABLE) ×2 IMPLANT
GOWN STRL REUS W/ TWL XL LVL3 (GOWN DISPOSABLE) ×1 IMPLANT
GOWN STRL REUS W/TWL LRG LVL3 (GOWN DISPOSABLE) ×4
GOWN STRL REUS W/TWL XL LVL3 (GOWN DISPOSABLE) ×3
KIT BASIN OR (CUSTOM PROCEDURE TRAY) ×3 IMPLANT
KIT ROOM TURNOVER OR (KITS) ×3 IMPLANT
MANIFOLD NEPTUNE II (INSTRUMENTS) ×3 IMPLANT
NS IRRIG 1000ML POUR BTL (IV SOLUTION) ×3 IMPLANT
PACK TOTAL JOINT (CUSTOM PROCEDURE TRAY) ×3 IMPLANT
PAD ARMBOARD 7.5X6 YLW CONV (MISCELLANEOUS) ×6 IMPLANT
STAPLER VISISTAT 35W (STAPLE) ×3 IMPLANT
SUT ETHIBOND 2 V 37 (SUTURE) ×3 IMPLANT
SUT MON AB 2-0 CT1 36 (SUTURE) ×6 IMPLANT
SUT PDS AB 1 CT  36 (SUTURE) ×4
SUT PDS AB 1 CT 36 (SUTURE) ×2 IMPLANT
SUT VIC AB 0 CT1 27 (SUTURE)
SUT VIC AB 0 CT1 27XBRD ANBCTR (SUTURE) IMPLANT
SYR CONTROL 10ML LL (SYRINGE) ×3 IMPLANT
TOWEL OR 17X24 6PK STRL BLUE (TOWEL DISPOSABLE) ×3 IMPLANT
TOWEL OR 17X26 10 PK STRL BLUE (TOWEL DISPOSABLE) ×3 IMPLANT

## 2016-06-22 NOTE — Progress Notes (Signed)
Patient returned to room from PACU. S/P Open treatment of left femoral fracture. Patient sleeping. Arouses easily to voice. Dressing intact to left hip. Ice bag to site. Call bell in reach.

## 2016-06-22 NOTE — Anesthesia Preprocedure Evaluation (Addendum)
Anesthesia Evaluation  Patient identified by MRN, date of birth, ID band Patient awake    Reviewed: Allergy & Precautions, Patient's Chart, lab work & pertinent test results  Airway Mallampati: II  TM Distance: >3 FB     Dental  (+) Teeth Intact, Dental Advisory Given   Pulmonary neg pulmonary ROS,    breath sounds clear to auscultation       Cardiovascular hypertension,  Rhythm:Regular Rate:Normal     Neuro/Psych    GI/Hepatic negative GI ROS, Neg liver ROS,   Endo/Other  negative endocrine ROS  Renal/GU negative Renal ROS     Musculoskeletal   Abdominal   Peds  Hematology   Anesthesia Other Findings   Reproductive/Obstetrics                             Anesthesia Physical Anesthesia Plan  ASA: III  Anesthesia Plan: General   Post-op Pain Management:    Induction: Intravenous  Airway Management Planned: Oral ETT  Additional Equipment:   Intra-op Plan:   Post-operative Plan: Extubation in OR  Informed Consent: I have reviewed the patients History and Physical, chart, labs and discussed the procedure including the risks, benefits and alternatives for the proposed anesthesia with the patient or authorized representative who has indicated his/her understanding and acceptance.   Dental advisory given and Dental Advisory Given  Plan Discussed with: Anesthesiologist  Anesthesia Plan Comments:       Anesthesia Quick Evaluation

## 2016-06-22 NOTE — Brief Op Note (Signed)
06/20/2016 - 06/22/2016  4:35 PM  PATIENT:  Cindy Clarke  83 y.o. female  PRE-OPERATIVE DIAGNOSIS:  left hip fracture  POST-OPERATIVE DIAGNOSIS:  left hip fracture  PROCEDURE:  Procedure(s): ARTHROPLASTY BIPOLAR HIP (HEMIARTHROPLASTY) (Left)  SURGEON:  Surgeon(s) and Role:    * Yolonda Kida, MD - Primary  PHYSICIAN ASSISTANT:   ASSISTANTS: Hart Carwin, RNFA   ANESTHESIA:   general  EBL:  Total I/O In: 0  Out: 850 [Urine:750; Blood:100]  BLOOD ADMINISTERED:none  DRAINS: none   LOCAL MEDICATIONS USED:  MARCAINE     SPECIMEN:  No Specimen  DISPOSITION OF SPECIMEN:  N/A  COUNTS:  YES  TOURNIQUET:  * No tourniquets in log *  DICTATION: .Note written in EPIC  PLAN OF CARE: Admit to inpatient   PATIENT DISPOSITION:  PACU - hemodynamically stable.   Delay start of Pharmacological VTE agent (>24hrs) due to surgical blood loss or risk of bleeding: not applicable

## 2016-06-22 NOTE — Progress Notes (Signed)
Report called to [facility] in short stay.

## 2016-06-22 NOTE — Progress Notes (Signed)
OT Cancellation Note  Patient Details Name: Shylynn Storie MRN: [id number] DOB: [birth year]   Cancelled Treatment:    Reason Eval/Treat Not Completed: Other (comment). Awaitin gsurgery. Please reorder post op. thanks  [facility] Dayleen Beske, OT/L  [phone] 06/22/2016 06/22/2016, 7:34 AM

## 2016-06-22 NOTE — Anesthesia Procedure Notes (Signed)
Procedure Name: Intubation Date/Time: 06/22/2016 3:08 PM Performed by: Neldon Newport Pre-anesthesia Checklist: Timeout performed, Patient being monitored, Suction available, Emergency Drugs available and Patient identified Patient Re-evaluated:Patient Re-evaluated prior to inductionOxygen Delivery Method: Circle system utilized Preoxygenation: Pre-oxygenation with 100% oxygen Intubation Type: IV induction Ventilation: Mask ventilation without difficulty Laryngoscope Size: Mac and 3 Grade View: Grade II Tube type: Oral Tube size: 7.0 mm Number of attempts: 1 Placement Confirmation: breath sounds checked- equal and bilateral,  positive ETCO2 and ETT inserted through vocal cords under direct vision Secured at: 21 cm Tube secured with: Tape Dental Injury: Teeth and Oropharynx as per pre-operative assessment

## 2016-06-22 NOTE — Progress Notes (Signed)
Patient's brown purse and red cell phone placed in the cabinet inside of patient's room.

## 2016-06-22 NOTE — H&P (Signed)
H&P update  The surgical history has been reviewed and remains accurate without interval change.  The patient was re-examined and patient's physiologic condition has not changed significantly in the last 30 days. The condition still exists that makes this procedure necessary. The treatment plan remains the same, without new options for care.  No new pharmacological allergies or types of therapy has been initiated that would change the plan or the appropriateness of the plan.  The patient and/or family understand the potential benefits and risks.  Marthella Osorno P. Aundria Rud, MD 06/22/2016 2:38 PM

## 2016-06-22 NOTE — Op Note (Signed)
ARTHROPLASTY BIPOLAR HIP (HEMIARTHROPLASTY)  Cindy Clarke   [id number]  Pre-op Diagnosis: left hip fracture     Post-op Diagnosis: same   Operative Procedures  1. Open treatment of femoral fracture, proximal end, neck, prosthetic replacement CPT 27236  Personnel  Surgeon(s): Jason Patrick Rogers, MD   Anesthesia: General   Prosthesis: Depuy Corail Femur: 10 Head: Co-Chromium size: 43 Bearing Type: Bipolar  Date of Service: 06/20/2016 - 06/22/2016  Indication:Cindy Clarke is a 83 y.o.. year old female who suffered a ground level fall and was found to have sustained a Left hip femoral neck fracture. The patient was found to have a hip fracture that was appropriate for operative management. We reviewed the risk and benefits with the patient and family and they elected to proceed.  Procedure: After informed consent was obtained and understanding of the risk were voiced including but not limited to bleeding, infection, damage to surrounding structures including nerves and vessels, blood clots, leg length inequality, dislocation and the failure to achieve desired results, including death, the operative extremity was marked with verbal confirmation of the patient in the holding area.   The patient was then brought to the operating room and transported to the operating room table and placed in the lateral decubitus position. The operative limb was then prepped and draped in the usual sterile fashion and preoperative antibiotics were administered.  A time out was performed prior to the start of surgery confirming the correct extremity, preoperative antibiotic administration, as well as team members, and that implants and instruments available for the case. Correct surgical site was also confirmed with preoperative radiographs. A standard posterior approach to the hip was performed. A capsulotomy was performed and the capsule tagged for later repair. The labrum was preserved. The hip was  dislocated and the femoral neck cut was made at approximately 1.5 cm above the level of the lesser trochanter using the femoral neck cutting guide. We then used a corkscrew and cobb to remove the femoral head from the acetabulum. We measured the head and proceeded to trial head sizes and found 43 mm to the the appropriate size. We then turned our attention to femoral preparation. We began sequential broaching to a size 10 stem. This size produced good fit and rotational stability. We placed the trial neck and a 43mm +5 head.  Leg lengths were evaluated on the table. The hip was stable in extension and external rotation without impingement. The hip was stable in deep flexion. In 90 degrees of flexion and neutral abduction the hip was stable to 80 degrees of internal rotation. In a position of sleep with hip adducted across the body the hip was stable to 70 degrees of rotation.  We then turned back to the femur and tested the broach for stability and fit, and removed the trial broach. The final femoral implant was placed, and the t49(613Marland KitchenAdvaMarion Eye Specialists Surgery CenLorretta Almyra Fr216-05ABaltaGwMarland Kitche440102725in Adeevices trialed for stability. We then irrigated and dri3(651Marland KitchenAdvaMercy Specialty Hospital Of Southeast KanLorretta Almyra Fr([phone])02ABaltaGwMarland Kitche440102725in Adeevices the final head was placed. We placed an 7[facility]-EGe59840915Marland KitchenAdvaSpectrum Health Big Rapids HospiLorretta Almyra Fr620-44ABaltaGwMarland Kitche4479(865Marland KitchenAdvaUniontown HospiLorretta Almyra Fr859-26ABaltaGwMarland Kitche4427409Marland KitchenAdvaBlack River Community Medical CenLorretta Almyra Fr614-88ABaltaGwMarland Kitche4456507Marland KitchenAdvaBeltway Surgery Center Iu HeaLorretta Almyra Fr8485ABaltaGwMarland Kitche4474507Marland KitchenAdvaEndeavor Surgical CenLorretta Almyra Fr604-40ABaltaGwMarland [id number] Adeevicescare At [facility] Allian79Grand River Medical CenteGe567 EaColumbus Regional Healthcare Syst3486Marland KitchenAdvaFountain Valley Rgnl Hosp And Med Ctr - WarLorretta Almyra Fr279-85ABaltaGwMarland Kitche440102725in Adeevicesenter LLC Dba [facility] Surgical CenteG13786Marland KitchenAdvaSunrise Hospital And Medical CenLorretta Almyra Fr719-68ABaltaGwMarland [id number] Adeevicesow Lane Infirma88Agh Laveen LLGe1 S. [facility] Dba [facility] Cent59[place] Endosco42732Marland KitchenAdvaMemorial Care Surgical Center [facility] Lorretta Almyra Fr878 10ABaltaGwMarland Kitche4461(806)Marland KitchenAdvaCec Surgical Services Lorretta Almyra Fr([phone]) 31ABaltaGwMarland Kitche4422445Marland KitchenAdvaCentral Indiana Amg Specialty Hospital Lorretta Almyra Fr405-55ABaltaGwMarland Kitche4425918Marland KitchenAdvaLakeland Surgical And Diagnostic Center LLP Griffin CamLorretta Almyra Fr636-05ABaltaGwMarland Kitche4455575Marland KitchenAdvaCommunity Memorial Hospital-San BuenaventLorretta Almyra Fr([phone])51ABaltaGwMarland Kitche4433220Marland KitchenAdvaOrthopaedic Surgery CenLorretta Almyra Fr([phone])23ABaltaGwMarland Kitche440102725in Adeevicesevon [facility]44Mer[facility] Surgery CenteGe251 Rambl12774Marland KitchenAdvaSaint Francis Medical CenLorretta Almyra Fr681-21ABaltaGwMarland Kitche4444(26[facility] Alexander CampLorrettaABal440102725Najjarevicesssissippi Medical Center - Grena218367Marland KitchenAdvaChristus St Vincent Regional Medical CenLorretta Almyra Fr574-62ABaltaGwMarland Kitche4434501Marland KitchenAdvaEye Institute Surgery Center Lorretta Almyra Fr([phone])66ABaltaGwMarland Kitche440102725in AdeevicesospitaGe8690 N. HudsChildren'S Hospital Of Alaba8561470Marland KitchenAdvaNacogdoches Memorial HospiLorretta Almyra Fr765-29ABaltaGwMarland Kitche44277Marland KitchenAdvaGov Juan [facility] Luis Hospital & Medical Lorretta Almyra Fr305-26ABaltaGwMarland Kitche440102725in AdeevicespitaGe62 RosewoLouisiana Extended Care Hospital Of Natchitoch145563Marland KitchenAdvaCentral Virginia Surgi Center LP Dba Surgi Center Of Central VirgiLorretta Almyra Fr757-26ABaltaGwMarland Kitche4454(325Marland KitchenAdvaBeckley Va Medical CenLorretta Almyra Fr405-50ABaltaGwMarland Kitche440102725in AdeevicesAmbulatory Surgery Center LLGe7838 Cedar73607Marland KitchenAdvaWilkes-Barre Veterans Affairs Medical CenLorretta Almyra Fr([phone]) 60ABaltaGwMarland Kitche4437319Marland KitchenAdvaHudson Valley Center For Digestive Health Lorretta Almyra Fr([phone]) 06ABaltaGwMarland Kitche4473629Marland KitchenAdvaCrossroads Community HospiLorretta Almyra Fr3012ABaltaGwMarland Kitche440102725in Adeevicesurgery Cent10[facility] LGe98 MechaniTaravista Behavioral Health Cent54Belton Regional Medical CenteGe82 Marvon Uvalde Memorial77858Marland KitchenAdvaColumbia Gastrointestinal Endoscopy CenLorretta Almyra Fr937-24ABaltaGwMarland Kitche4453(478Marland KitchenAdvaCrook County Medical Services DistrLorretta Almyra Fr403-23ABaltaGwMarland Kitche4466669Marland KitchenAdvaCigna Outpatient Surgery CenLorretta Almyra Fr563 83ABaltaGwMarland Kitche440102725in Adeevicesmit Medical Corporation Premier Surgery Center Dba B[facility] Endoscopy CenteGe696 [facility] L28Tradition Surgery CenteGe7459 E. ConstitutiBaylor Scott & White Medical Center - Marble Fal33[place]ens[place] HealtGe411 ParkHennepin County Medical C47Beverly Hills Surgery [facility] LGe7693 [place] [facility]-Yu59Harry S. Truman Memorial Veterans HospitaGe855 RailroaMs Band Of Choctaw Hospit80Susitna Surgery Center LLGe78 QueSouthern Surgical Hospit64Lindsay House Surgery Center LLGe146 [place] [facility] C65Ever[facility] Endoscopy Center LLGe903 North BriarwooAcoma-Canoncito-Laguna (Acl) HospitalAve.mese Dillingip was again reduced, and taken through a range of motion and found to be stable as above. The hip was thoroughly irrigated, and a posterior capsular repair was performed with #2 Ethibond. The wound was irrigated with normal saline. The deep fascia was closed with 1 PDS, 0 vicryl for the deep fat layer, and 2.0 Monocryl for the subcutaneous tissue. The skin was closed with staples. A sterile dressing was applied. The patient was awakened and transported to  the recovery room in stable condition. All sponge, needle, and instrument counts were correct at the end of the case.  Position: lateral decubitus   Complications: none.  Time Out: performed   Drains/Packing: None  Estimated blood loss: 100 mL  Returned to Recovery Room: in good  condition.   Antibiotics: yes   Mechanical VTE (DVT) Prophylaxis: sequential compression devices, TED thigh-high  Chemical VTE (DVT) Prophylaxis: Twice a day aspirin 1 month.  Fluid Replacement  Crystalloid: see anesthesia record Blood: none  FFP: none   Specimens Removed: 1 to pathology   Sponge and Instrument Count Correct? yes   PACU: portable radiograph - low AP pelvis  Admission: inpatient status, start PT & OT POD#1  Plan/RTC: Return in 2 weeks for wound check.  Weight Bearing/Load Lower Extremity: full  Posterior hip precautions   Maryan Rued, MD [phone] [facility] 5:21 PM

## 2016-06-22 NOTE — Transfer of Care (Signed)
Immediate Anesthesia Transfer of Care Note  Patient: Cindy Clarke  Procedure(s) Performed: Procedure(s): ARTHROPLASTY BIPOLAR HIP (HEMIARTHROPLASTY) (Left)  Patient Location: PACU  Anesthesia Type:General  Level of Consciousness: awake, alert , oriented and patient cooperative  Airway & Oxygen Therapy: Patient Spontanous Breathing and Patient connected to nasal cannula oxygen  Post-op Assessment: Report given to RN, Post -op Vital signs reviewed and stable and Patient moving all extremities X 4  Post vital signs: Reviewed and stable  Last Vitals:  Vitals:   06/22/16 1336 06/22/16 1630  BP: (!) 178/82   Pulse: 89   Resp: 17   Temp: 37 C 36.2 C    Last Pain:  Vitals:   06/22/16 1336  TempSrc: Oral  PainSc:       Patients Stated Pain Goal: 2 (06/22/16 0656)  Complications: No apparent anesthesia complications

## 2016-06-22 NOTE — Progress Notes (Signed)
PHARMACY NOTE:  ANTIMICROBIAL RENAL DOSAGE ADJUSTMENT  Current antimicrobial regimen includes a mismatch between antimicrobial dosage and estimated renal function.  As per policy approved by the Pharmacy & Therapeutics and Medical Executive Committees, the antimicrobial dosage will be adjusted accordingly.  Current antimicrobial dosage:  Ancef 2gm IV Q6H x 3 doses  Indication: surgical prophylaxis  Renal Function:  Estimated Creatinine Clearance: 26.5 mL/min (by C-G formula based on SCr of 1.32 mg/dL (H)). []      On intermittent HD, scheduled: []      On CRRT    Antimicrobial dosage has been changed to:  Ancef 2gm IV Q12H x 2 soes  Additional comments:   Thank you for allowing pharmacy to be a part of this patient's care.   Fed Ceci D. Laney Potash, PharmD, BCPS Pager:  [phone] 06/22/2016, 5:51 PM

## 2016-06-22 NOTE — Discharge Instructions (Signed)
-  Full weightbearing as tolerated to the left hip -Posterior hip precautions as discussed with her physical therapist for the next 6 weeks -Take an 81 mg aspirin tablet twice daily for 30 days for prevention of blood clots -Maintain postoperative dressing in 2 year follow-up appointment.  It is okay to shower with the dressing in place. -Weightbearing as tolerated with your assistive device as instructed by physical therapist.

## 2016-06-22 NOTE — Progress Notes (Signed)
PROGRESS NOTE    Cindy Clarke  LJQ:[id number] DOB: Aug 18, 1930 DOA: 06/20/2016 PCP: No primary care provider on file.   Brief Narrative:  Cindy Clarke a 83 y.o.femalewith PMH of Hyperlipidemia and Osteoporosis who presented to the [facility] after a fall. She was stepping off a curb after shopping and isn't sure how, but misstepped and landed on her left side with immediate pain in her left hip. The pain is severe, nonradiating, constant, waxing and waning, worse with any movement, and only moderate when still. She denies any preceding symptoms including palpitations, chest pain, dyspnea or light-headedness. No LOC or head trauma. She was hypertensive on arrival and in severe pain in the left hip with deformity on exam. XR confirmed left femur fracture. Orthopedics consulted and planning operative intervention. TRH called to admit and patient was transferred to [facility] for Surgical Intervention on 06/22/16.   Assessment & Plan:   Principal Problem:   Femoral neck fracture (HCC) Active Problems:   Osteoarthritis   Creatinine elevation   HTN (hypertension)   Diastolic dysfunction without heart failure   Leukocytosis  Left Subcapital femoral neck fracture: -In setting of mechanical fall.  -Denies dyspnea , chest pain, ECHO with Normal EF of 60-65%.  -Hip X-Ray showed Acute minimally displaced and impacted left subcapital femoral neck Fracture.  -Patient to undergo surgical intervention today by Dr. Missy Sabins -Pain management with  Hydromorphone 0.5 mg po q2hprn for Severe Pain -Bowel Regimen with Miralax 17 grams daily and with Senna-Docusate 1 tab po Daily  Probably AKI on CKD -Unknown  baseline.  -Cr on admission at 1.67 and improved to 1.32 -C/w IV Fluids with NS at 75 mL/hr.  -Repeat CMP in AM  Mild Leukocytosis -Likely reactive to Pain -Repeat CBC in AM  HTN -BP may be elevated 2/2 to Pain -Patient was not taking medications for HTN.  -C/w Amlodipine 5 mg po  Daily -C/w Hydralazine 5 mg IV q4hprn for SBP >170  Hyperlipidemia -Patient no longer Taking Pravastatin -Outpatient Lipid Panel and follow up with PCP  Diastolic Grade 1 Dysfunction -Not Decompensated currently -Strict I's and O's and Daily weights -Continue to monitor Volume Status Closely  DVT prophylaxis: Heparin 5,000 sq q8h Code Status: FULL CODE Family Communication: No Family present at bedside Disposition Plan: Likely SNF pending PT evaluation post-operatively  Consultants:  - Orthopedics   Procedures:  ECHOCARDIOGRAM Study Conclusions  - Left ventricle: The cavity size was normal. Wall thickness was   increased in a pattern of mild LVH. Systolic function was normal.   The estimated ejection fraction was in the range of 60% to 65%.   Wall motion was normal; there were no regional wall motion   abnormalities. Doppler parameters are consistent with abnormal   left ventricular relaxation (grade 1 diastolic dysfunction). - Aortic valve: Mildly calcified annulus. - Pulmonary arteries: Systolic pressure was moderately increased.   PA peak pressure: 46 mm Hg (S).  Antimicrobials: Anti-infectives    Start     Dose/Rate Route Frequency Ordered Stop   06/22/16 0800  ceFAZolin (ANCEF) IVPB 2g/100 mL premix     2 g 200 mL/hr over 30 Minutes Intravenous To ShortStay Surgical 06/21/16 1447 06/22/16 1524     Subjective: Seen and examined and is hard of hearing. Was having Pain in Left Hip. No N/V. No CP or SOB.   Objective: Vitals:   06/22/16 0449 06/22/16 0547 06/22/16 0637 06/22/16 1336  BP: (!) 202/79 (!) 179/64 (!) 173/69 (!) 178/82  Pulse: 80  75 79 89  Resp: 16   17  Temp: 98.5 F (36.9 C)   98.6 F (37 C)  TempSrc: Oral   Oral  SpO2: 96%   98%  Weight:      Height:        Intake/Output Summary (Last 24 hours) at 06/22/16 1625 Last data filed at 06/22/16 1610  Gross per 24 hour  Intake              731 ml  Output             1825 ml  Net             -1094 ml   Filed Weights   06/20/16 1346 06/21/16 1440  Weight: 63.5 kg (140 lb) 65.1 kg (143 lb 8 oz)   Examination: Physical Exam:  Constitutional: Elderly female who was uncomfortable Eyes: Lids and conjunctivae normal, sclerae anicteric  ENMT: External Ears, Nose appear normal. Hard of hearing.  Neck: Appears normal, supple, no cervical masses, normal ROM, no appreciable thyromegaly, no JVD Respiratory: Clear to auscultation bilaterally, no wheezing, rales, rhonchi or crackles. Normal respiratory effort and patient is not tachypenic. No accessory muscle use.  Cardiovascular: RRR, no murmurs / rubs / gallops. S1 and S2 auscultated. No extremity edema. Abdomen: Soft, non-tender, non-distended. No masses palpated. No appreciable hepatosplenomegaly. Bowel sounds positive.  GU: Deferred. Musculoskeletal: No clubbing / cyanosis of digits/nails. Left Hip slightly painful to palpation Skin: No rashes, lesions, ulcers. No induration; Warm and dry. Had skin tear on Left knee. Neurologic: CN 2-12 grossly intact with no focal deficits.Romberg sign cerebellar reflexes not assessed.  Psychiatric: Normal judgment and insight. Alert and oriented x 3. Normal mood and appropriate affect.   Data Reviewed: I have personally reviewed following labs and imaging studies  CBC:  Recent Labs Lab 06/20/16 1400 06/21/16 0445 06/22/16 0404  WBC 10.2 12.0* 11.2*  NEUTROABS 7.7  --   --   HGB 14.5 12.9 13.2  HCT 41.7 38.5 39.9  MCV 88.3 90.0 91.1  PLT 328 318 306   Basic Metabolic Panel:  Recent Labs Lab 06/20/16 1400 06/21/16 0445 06/22/16 0404  NA 140 139 137  K 4.0 3.6 3.6  CL 105 104 105  CO2 24 25 22   GLUCOSE 102* 118* 102*  BUN 35* 36* 24*  CREATININE 1.67* 1.50* 1.32*  CALCIUM 9.8 9.2 8.8*   GFR: Estimated Creatinine Clearance: 26.5 mL/min (by C-G formula based on SCr of 1.32 mg/dL (H)). Liver Function Tests: No results for input(s): AST, ALT, ALKPHOS, BILITOT, PROT, ALBUMIN in  the last 168 hours. No results for input(s): LIPASE, AMYLASE in the last 168 hours. No results for input(s): AMMONIA in the last 168 hours. Coagulation Profile:  Recent Labs Lab 06/20/16 1440  INR 1.01   Cardiac Enzymes: No results for input(s): CKTOTAL, CKMB, CKMBINDEX, TROPONINI in the last 168 hours. BNP (last 3 results) No results for input(s): PROBNP in the last 8760 hours. HbA1C: No results for input(s): HGBA1C in the last 72 hours. CBG: No results for input(s): GLUCAP in the last 168 hours. Lipid Profile: No results for input(s): CHOL, HDL, LDLCALC, TRIG, CHOLHDL, LDLDIRECT in the last 72 hours. Thyroid Function Tests: No results for input(s): TSH, T4TOTAL, FREET4, T3FREE, THYROIDAB in the last 72 hours. Anemia Panel: No results for input(s): VITAMINB12, FOLATE, FERRITIN, TIBC, IRON, RETICCTPCT in the last 72 hours. Sepsis Labs: No results for input(s): PROCALCITON, LATICACIDVEN in the last 168 hours.  Recent Results (from  the past 240 hour(s))  Surgical pcr screen     Status: Abnormal   Collection Time: 06/21/16  5:00 AM  Result Value Ref Range Status   MRSA, PCR NEGATIVE NEGATIVE Final   Staphylococcus aureus POSITIVE (A) NEGATIVE Final    Comment:        The Xpert SA Assay (FDA approved for NASAL specimens in patients over 72 years of age), is one component of a comprehensive surveillance program.  Test performance has been validated by [facility] for patients greater than or equal to 72 year old. It is not intended to diagnose infection nor to guide or monitor treatment.     Radiology Studies: No results found. Scheduled Meds: . [MAR Hold] amLODipine  5 mg Oral Daily  . [MAR Hold] Chlorhexidine Gluconate Cloth  6 each Topical Daily  . [MAR Hold] heparin  5,000 Units Subcutaneous Q8H  . [MAR Hold] mupirocin ointment  1 application Nasal BID  . [MAR Hold] polyethylene glycol  17 g Oral Daily  . [MAR Hold] senna-docusate  1 tablet Oral Daily    Continuous Infusions: . sodium chloride 75 mL/hr at 06/22/16 0551  . lactated ringers 10 mL/hr at 06/22/16 1426    LOS: 2 days   Merlene Laughter, DO [facility] Pager [phone]  If 7PM-7AM, please contact night-coverage [email or web address] Password [email or web address] 06/22/2016, 4:25 PM

## 2016-06-23 ENCOUNTER — Encounter (HOSPITAL_COMMUNITY): Payer: Self-pay | Admitting: Orthopedic Surgery

## 2016-06-23 LAB — CBC WITH DIFFERENTIAL/PLATELET
Basophils Absolute: 0.1 10*3/uL (ref 0.0–0.1)
Basophils Relative: 1 %
EOS ABS: 0.3 10*3/uL (ref 0.0–0.7)
EOS PCT: 4 %
HCT: 35.1 % — ABNORMAL LOW (ref 36.0–46.0)
Hemoglobin: 11.9 g/dL — ABNORMAL LOW (ref 12.0–15.0)
LYMPHS ABS: 1.5 10*3/uL (ref 0.7–4.0)
Lymphocytes Relative: 16 %
MCH: 30.8 pg (ref 26.0–34.0)
MCHC: 33.9 g/dL (ref 30.0–36.0)
MCV: 90.9 fL (ref 78.0–100.0)
MONO ABS: 1.2 10*3/uL — AB (ref 0.1–1.0)
Monocytes Relative: 13 %
Neutro Abs: 6.3 10*3/uL (ref 1.7–7.7)
Neutrophils Relative %: 66 %
PLATELETS: 245 10*3/uL (ref 150–400)
RBC: 3.86 MIL/uL — AB (ref 3.87–5.11)
RDW: 13.5 % (ref 11.5–15.5)
WBC: 9.5 10*3/uL (ref 4.0–10.5)

## 2016-06-23 LAB — COMPREHENSIVE METABOLIC PANEL
ALBUMIN: 3 g/dL — AB (ref 3.5–5.0)
ALK PHOS: 61 U/L (ref 38–126)
ALT: 14 U/L (ref 14–54)
ALT: 16 U/L (ref 14–54)
ANION GAP: 9 (ref 5–15)
ANION GAP: 11 (ref 5–15)
AST: 34 U/L (ref 15–41)
AST: 39 U/L (ref 15–41)
Albumin: 3.1 g/dL — ABNORMAL LOW (ref 3.5–5.0)
Alkaline Phosphatase: 57 U/L (ref 38–126)
BUN: 17 mg/dL (ref 6–20)
BUN: 18 mg/dL (ref 6–20)
CALCIUM: 8.3 mg/dL — AB (ref 8.9–10.3)
CHLORIDE: 107 mmol/L (ref 101–111)
CO2: 19 mmol/L — AB (ref 22–32)
CO2: 20 mmol/L — AB (ref 22–32)
CREATININE: 1.28 mg/dL — AB (ref 0.44–1.00)
Calcium: 8.4 mg/dL — ABNORMAL LOW (ref 8.9–10.3)
Chloride: 104 mmol/L (ref 101–111)
Creatinine, Ser: 1.26 mg/dL — ABNORMAL HIGH (ref 0.44–1.00)
GFR calc Af Amer: 44 mL/min — ABNORMAL LOW (ref >60)
GFR calc non Af Amer: 38 mL/min — ABNORMAL LOW (ref >60)
GFR, EST AFRICAN AMERICAN: 44 mL/min — AB (ref >60)
GFR, EST NON AFRICAN AMERICAN: 38 mL/min — AB (ref >60)
GLUCOSE: 152 mg/dL — AB (ref 65–99)
Glucose, Bld: 86 mg/dL (ref 65–99)
POTASSIUM: 3.7 mmol/L (ref 3.5–5.1)
Potassium: 3.5 mmol/L (ref 3.5–5.1)
SODIUM: 135 mmol/L (ref 135–145)
Sodium: 135 mmol/L (ref 135–145)
Total Bilirubin: 0.7 mg/dL (ref 0.3–1.2)
Total Bilirubin: 0.5 mg/dL (ref 0.3–1.2)
Total Protein: 5.9 g/dL — ABNORMAL LOW (ref 6.5–8.1)
Total Protein: 6 g/dL — ABNORMAL LOW (ref 6.5–8.1)

## 2016-06-23 LAB — PHOSPHORUS: PHOSPHORUS: 2.6 mg/dL (ref 2.5–4.6)

## 2016-06-23 LAB — MAGNESIUM: MAGNESIUM: 1.8 mg/dL (ref 1.7–2.4)

## 2016-06-23 MED ORDER — HYDROCODONE-ACETAMINOPHEN 5-325 MG PO TABS
1.0000 | ORAL_TABLET | Freq: Four times a day (QID) | ORAL | Status: DC | PRN
  Administered 2016-06-23 – 2016-06-28 (×17): 1 via ORAL
  Filled 2016-06-23 (×19): qty 1

## 2016-06-23 NOTE — Clinical Social Work Note (Signed)
Clinical Social Work Assessment  Patient Details  Name: Cindy Clarke MRN: [id number] Date of Birth: 10/31/1930  Date of referral:  06/23/16               Reason for consult:  Facility Placement, Discharge Planning                Permission sought to share information with:  Family Supports Permission granted to share information::  Yes, Verbal Permission Granted  Name::     Alexis Tegeler  Relationship::  daughter  Contact Information:  [phone]  Housing/Transportation Living arrangements for the past 2 months:  Hotel/Motel Source of Information:  Patient, Adult Children Patient Interpreter Needed:  None Criminal Activity/Legal Involvement Pertinent to Current Situation/Hospitalization:  No - Comment as needed Significant Relationships:  Adult Children Lives with:  Self Do you feel safe going back to the place where you live?  Yes Need for family participation in patient care:  Yes (Comment)  Care giving concerns:  Pt is in need of SNF for STR  Social Worker assessment / plan:  CSW met with pt to address consult for New SNF. CSW introduced herself and explained role of social work. CSW also explained the process of discharging to SNF. Pt stated that she lives alone in a motel in the [place], [place] area, and declined SNF. Pt reported she was visiting her son in [place] when she fell and fracturing her hip, which required surgery. Pt asked for CSW to speak with her daughter, Alexis, to address discharge plan.   CSW spoke with Alexis over the phone to address discharge plan. She is in agreement with STR at SNF. Pt's daughter requested placement closer to her home in [place] area, and would provide the transportation to facility. CSW also inquired about a local facility to ensure a secure referral for STR. Pt's daughter in agreement.   CSW initiated SNF search, [facility] is pending, and CSW will follow up with bed offers. CSW will continue to follow.   Employment status:   Retired Insurance information:  Managed Medicare PT Recommendations:  Skilled Nursing Facility Information / Referral to community resources:  Skilled Nursing Facility   Patient/Family's Response to care:  Pt and pt's daughter were appreciative of CSW support.   Patient/Family's Understanding of and Emotional Response to Diagnosis, Current Treatment, and Prognosis:  Pt's daughter understands that pt would benefit from STR at SNF. Pt feels that she would be going to a "home." CSW stressed that this is a temporary placement to build strength prior to returning home.   Emotional Assessment Appearance:  Appears stated age Attitude/Demeanor/Rapport:  Other (Appropriate) Affect (typically observed):  Accepting, Adaptable, Pleasant Orientation:  Oriented to Self, Oriented to Place, Oriented to  Time, Oriented to Situation Alcohol / Substance use:  Not Applicable Psych involvement (Current and /or in the community):  No (Comment)  Discharge Needs  Concerns to be addressed:  Adjustment to Illness Readmission within the last 30 days:  No Current discharge risk:  Chronically ill Barriers to Discharge:  Continued Medical Work up    , LCSW 06/23/2016, 2:24 PM  

## 2016-06-23 NOTE — Consult Note (Signed)
[facility] CM Primary Care Navigator  06/23/2016  Cindy Clarke [birth year] [id number]   Met with patient at the bedside to identify possible discharge needs. Patient reports having a fall and could not get up which led to this admission/surgery.  Patient verbalized not seeing Dr. Scarlette Calico with [facility]) for primary care provider anymore.  Patient expressed full understanding to call and see a primary care provider for a post discharge follow-up appointment when she returns back home.  [facility] list of primary care providers offered to patient but she refused stating "I will take care of it myself when I get home, I will find my own doctor".  Patient letter provided as her reminder.    Patient shared [facility] [place] to obtain medications without any problem.   Patient reports managing her own medications at home using "pill box" system.   She mentioned that daughter Ubaldo Glassing) will be providing transportation to her doctors' appointments after discharge.  Daughter will be her primary caregiver at home as stated.   Anticipated discharge plan is home with home health services.  Patient denies of any needs or concerns at this time.  For additional questions please contact:  Edwena Felty A. Seline Enzor, BSN, RN-BC [facility] PRIMARY CARE Navigator Cell: [phone]

## 2016-06-23 NOTE — Evaluation (Signed)
Occupational Therapy Evaluation Patient Details Name: Cindy Clarke MRN: [id number] DOB: [birth year] Today's Date: 06/23/2016    History of Present Illness 83 y.o. female admitted with L hip fx, s/p bipolar hip hemiarthroplasty. PMH of anxiety, depression, OA.    Clinical Impression   Patient is s/p bipolar hip hemiarthroplasty surgery resulting in functional limitations due to the deficits listed below (see OT problem list). PTA was living at home alone in motel independent.  Patient will benefit from skilled OT acutely to increase independence and safety with ADLS to allow discharge SNF. Pt has no caregiver upon d/c and unable to complete transfers without total +2 (A). Pt repeating back posterior hip precautions and pointing to picture on the wall -- I will do that to put on my socks . Pt pointing to the picture with and "X" over it for bending at the hip. Pt educated again on inability to perform LB dressing with bending > 90 degrees. Pt demonstrates cognitive deficits.      Follow Up Recommendations  SNF    Equipment Recommendations  3 in 1 bedside commode;Wheelchair (measurements OT);Wheelchair cushion (measurements OT);Hospital bed    Recommendations for Other Services       Precautions / Restrictions Precautions Precautions: Fall;Posterior Hip Precaution Booklet Issued: Yes (comment) Precaution Comments: provided handout in room and reviewed with patient recall 3 out 3 precautions Restrictions Weight Bearing Restrictions: No LLE Weight Bearing: Weight bearing as tolerated      Mobility Bed Mobility Overal bed mobility: Needs Assistance Bed Mobility: Supine to Sit     Supine to sit: Total assist;+2 for physical assistance;HOB elevated Sit to supine: +2 for physical assistance;Total assist   General bed mobility comments: helicopter with pad total +2 (A) with yelling from patient with all mobility. Pt once eob stopped and reports "it hurt I dont want to hurt. I  thought medication was so suppose to make me not hurt" Pt once up reports feeling better for moving  Transfers Overall transfer level: Needs assistance Equipment used: Rolling walker (2 wheeled) Transfers: Sit to/from [employer] Sit to Stand: Max assist;+2 physical assistance Stand pivot transfers: Max assist;+2 physical assistance       General transfer comment: pt transfer to R side due to L LE deficits. pt needed max (A) with RW. pt taking steps. pt releasing RW handle and moving herself in the RW instead of the RW itself. pt using RW like a grab bar and walking aroudn the perimeter of RW instead of steping with RW.    Balance Overall balance assessment: Needs assistance;History of Falls   Sitting balance-Leahy Scale: Fair         Standing balance comment: NT                            ADL Overall ADL's : Needs assistance/impaired Eating/Feeding: Set up   Grooming: Set up;Oral care;Wash/dry face;Wash/dry hands;Brushing hair   Upper Body Bathing: Minimal assistance   Lower Body Bathing: Total assistance           Toilet Transfer: +2 for physical assistance;Maximal assistance             General ADL Comments: Pt OOB to chair this session. pt once in chair reports "i am feeling better now. Sorry I am an awful patient" pt screaming prior to any contact with L LE due to fear of movement. pt provided helicopter method to help with anxiety with movement.  Vision     Perception     Praxis      Pertinent Vitals/Pain Pain Assessment: Faces Pain Score: 8  Faces Pain Scale: Hurts worst Pain Location: L hip Pain Descriptors / Indicators: Grimacing Pain Intervention(s): Limited activity within patient's tolerance;Monitored during session;Premedicated before session;Repositioned;Patient requesting pain meds-RN notified;Ice applied     Hand Dominance Right   Extremity/Trunk Assessment Upper Extremity Assessment Upper Extremity  Assessment: Overall WFL for tasks assessed   Lower Extremity Assessment Lower Extremity Assessment: Defer to PT evaluation RLE Deficits / Details: knee ext -4/5 LLE Deficits / Details: ankle WNL, sensation intact to light touch, hip AAROM decr 50% limited by pain, knee ext -3/5 LLE: Unable to fully assess due to pain   Cervical / Trunk Assessment Cervical / Trunk Assessment: Kyphotic   Communication Communication Communication: HOH   Cognition Arousal/Alertness: Awake/alert Behavior During Therapy: WFL for tasks assessed/performed Overall Cognitive Status: Impaired/Different from baseline                 General Comments: pt states "i havent had pain medication. I am not doing anything until i get my medication" pt provided medication by RN 45 minutes prior. pt reports daughter is to (A) but SW reports contacting son regarding care. pt very vague in describing PTA living situation. pt reports "honey you just dont know what I have been through. I have so much to do"    General Comments       Exercises       Shoulder Instructions      Home Living Family/patient expects to be discharged to:: Skilled nursing facility Living Arrangements: Alone                               Additional Comments: living in motel PTA with uncertain if can stay with son      Prior Functioning/Environment Level of Independence: Independent                 OT Problem List: Decreased strength;Decreased activity tolerance;Impaired balance (sitting and/or standing);Decreased safety awareness;Decreased knowledge of use of DME or AE;Decreased knowledge of precautions;Decreased coordination;Decreased cognition;Cardiopulmonary status limiting activity;Pain   OT Treatment/Interventions: Self-care/ADL training;Therapeutic exercise;DME and/or AE instruction;Energy conservation;Therapeutic activities;Cognitive remediation/compensation;Patient/family education;Balance training    OT  Goals(Current goals can be found in the care plan section) Acute Rehab OT Goals Patient Stated Goal: none stated OT Goal Formulation: With patient Time For Goal Achievement: 07/07/16 Potential to Achieve Goals: Good  OT Frequency: Min 2X/week   Barriers to D/C: Decreased caregiver support          Co-evaluation              End of Session Equipment Utilized During Treatment: Gait belt;Rolling walker Nurse Communication: Mobility status;Precautions;Patient requests pain meds  Activity Tolerance: Patient limited by pain Patient left: in chair;with call bell/phone within reach;with chair alarm set   Time: 1045-1110 OT Time Calculation (min): 25 min Charges:  OT General Charges $OT Visit: 1 Procedure OT Evaluation $OT Eval High Complexity: 1 Procedure OT Treatments $Self Care/Home Management : 8-22 mins G-Codes:    Harolyn Rutherford 07/16/16, 3:03 PM   Mateo Flow   OTR/L Pager: [phone] Office: [phone] .

## 2016-06-23 NOTE — Clinical Social Work Placement (Signed)
   CLINICAL SOCIAL WORK PLACEMENT  NOTE  Date:  06/23/2016  Patient Details  Name: Cindy Clarke MRN: [id number] Date of Birth: [birth year]  Clinical Social Work is seeking post-discharge placement for this patient at the Skilled  Nursing Facility level of care (*CSW will initial, date and re-position this form in  chart as items are completed):  Yes   Patient/family provided with [facility] Clinical Social Work Department's list of facilities offering this level of care within the geographic area requested by the patient (or if unable, by the patient's family).  Yes   Patient/family informed of their freedom to choose among providers that offer the needed level of care, that participate in Medicare, Medicaid or managed care program needed by the patient, have an available bed and are willing to accept the patient.  Yes   Patient/family informed of [facility]'s ownership interest in [facility] and [facility], as well as of the fact that they are under no obligation to receive care at these facilities.  PASRR submitted to EDS on 06/23/16     PASRR number received on       Existing PASRR number confirmed on       FL2 transmitted to all facilities in geographic area requested by pt/family on 06/23/16     FL2 transmitted to all facilities within larger geographic area on       Patient informed that his/her managed care company has contracts with or will negotiate with certain facilities, including the following:            Patient/family informed of bed offers received.  Patient chooses bed at       Physician recommends and patient chooses bed at      Patient to be transferred to   on  .  Patient to be transferred to facility by       Patient family notified on   of transfer.  Name of family member notified:        PHYSICIAN       Additional Comment:    _______________________________________________ Dede Query, LCSW 06/23/2016, 2:17 PM

## 2016-06-23 NOTE — Evaluation (Signed)
Physical Therapy Evaluation Patient Details Name: Cindy Clarke MRN: [id number] DOB: Aug 05, 1930 Today's Date: 06/23/2016   History of Present Illness  83 y.o. female admitted with L hip fx, s/p bipolar hip hemiarthroplasty. PMH of anxiety, depression, OA.   Clinical Impression  Pt admitted with above diagnosis. Pt currently with functional limitations due to the deficits listed below (see PT Problem List). Pt had transferred from recliner to bed with heavy +2 assist from nurse techs just prior to PT entering room. +2 total assist for sit to supine. Reviewed posterior hip precautions and performed L hip exercises with assist. Activity tolerance is considerably limited by pain.  ST-SNF recommended.  Pt will benefit from skilled PT to increase their independence and safety with mobility to allow discharge to the venue listed below.       Follow Up Recommendations SNF;Supervision/Assistance - 24 hour    Equipment Recommendations  Rolling walker with 5" wheels    Recommendations for Other Services OT consult     Precautions / Restrictions Precautions Precautions: Fall;Posterior Hip Precaution Booklet Issued: Yes (comment) Precaution Comments: sign in room; reviewed precautions with pt Restrictions Weight Bearing Restrictions: No LLE Weight Bearing: Weight bearing as tolerated      Mobility  Bed Mobility Overal bed mobility: Needs Assistance Bed Mobility: Sit to Supine       Sit to supine: +2 for physical assistance;Total assist   General bed mobility comments: pt 10%, +2 to raise BLEs into bed and to lower trunk, +2 total to scoot up in bed  Transfers                 General transfer comment: heavy +2 recliner to bed transfer with nurse techs just prior to PT entering room  Ambulation/Gait             General Gait Details: unable 2* pain  Stairs            Wheelchair Mobility    Modified Rankin (Stroke Patients Only)       Balance Overall  balance assessment: Needs assistance;History of Falls   Sitting balance-Leahy Scale: Fair         Standing balance comment: NT                             Pertinent Vitals/Pain Pain Assessment: 0-10 Pain Score: 8  Pain Location: L hip Pain Descriptors / Indicators: Sore  Pt premedicated    Home Living Family/patient expects to be discharged to:: Skilled nursing facility Living Arrangements: Alone               Additional Comments: was living alone in motel PTA    Prior Function Level of Independence: Independent               Hand Dominance        Extremity/Trunk Assessment   Upper Extremity Assessment Upper Extremity Assessment: Defer to OT evaluation    Lower Extremity Assessment Lower Extremity Assessment: LLE deficits/detail;RLE deficits/detail RLE Deficits / Details: knee ext -4/5 LLE Deficits / Details: ankle WNL, sensation intact to light touch, hip AAROM decr 50% limited by pain, knee ext -3/5 LLE: Unable to fully assess due to pain    Cervical / Trunk Assessment Cervical / Trunk Assessment: Kyphotic  Communication   Communication: HOH (reads lips)  Cognition Arousal/Alertness: Awake/alert Behavior During Therapy: WFL for tasks assessed/performed Overall Cognitive Status: Within Functional Limits for tasks assessed  General Comments      Exercises Total Joint Exercises Ankle Circles/Pumps: AAROM;Both;10 reps;Supine Short Arc Quad: AAROM;Left;10 reps;Supine Heel Slides: AAROM;Left;10 reps;Supine Hip ABduction/ADduction: AAROM;Left;10 reps;Supine   Assessment/Plan    PT Assessment Patient needs continued PT services  PT Problem List Decreased strength;Decreased range of motion;Decreased activity tolerance;Decreased mobility;Decreased balance;Pain;Decreased knowledge of use of DME;Decreased knowledge of precautions          PT Treatment Interventions DME instruction;Gait training;Functional  mobility training;Therapeutic exercise;Therapeutic activities;Patient/family education    PT Goals (Current goals can be found in the Care Plan section)  Acute Rehab PT Goals Patient Stated Goal: none stated PT Goal Formulation: With patient Time For Goal Achievement: 06/30/16 Potential to Achieve Goals: Good    Frequency Min 3X/week   Barriers to discharge Decreased caregiver support      Co-evaluation               End of Session   Activity Tolerance: Patient limited by pain;Patient limited by fatigue Patient left: in bed;with call bell/phone within reach;with bed alarm set Nurse Communication: Mobility status         Time: 1610-9604 PT Time Calculation (min) (ACUTE ONLY): 13 min   Charges:   PT Evaluation $PT Eval Moderate Complexity: 1 Procedure     PT G Codes:        Ralene Bathe Kistler 06/23/2016, 1:06 PM [phone]

## 2016-06-23 NOTE — NC FL2 (Signed)
[place] MEDICAID FL2 LEVEL OF CARE SCREENING TOOL     IDENTIFICATION  Patient Name: Cindy Clarke Birthdate: 1931/04/14 Sex: female Admission Date (Current Location): 06/20/2016  [facility] and [place] Number:  Producer, television/film/video and Address:  [facility], [address], [place], [place] [address]      Provider Number: [id number]  Attending Physician Name and Address:  Merlene Laughter, DO  Relative Name and Phone Number:       Current Level of Care: Hospital Recommended Level of Care: Skilled Nursing Facility Prior Approval Number:    Date Approved/Denied:   PASRR Number:    Discharge Plan: SNF    Current Diagnoses: Patient Active Problem List   Diagnosis Date Noted  . Diastolic dysfunction without heart failure 06/22/2016  . Leukocytosis 06/22/2016  . Femoral neck fracture (HCC) 06/20/2016  . Creatinine elevation 06/20/2016  . HTN (hypertension) 06/20/2016  . Other abnormal glucose 03/18/2013  . Need for prophylactic vaccination against Streptococcus pneumoniae (pneumococcus) 03/18/2013  . Routine general medical examination at a health care facility 03/18/2013  . Other screening mammogram 03/17/2013  . Hard of hearing 05/29/2012  . Thoracic vertebral fracture (HCC) 10/26/2010  . Insomnia 09/15/2010  . Allergic rhinitis due to other allergen 04/21/2010  . Hyperlipidemia 12/30/2009  . Osteoarthritis 12/30/2009  . OSTEOPOROSIS 12/30/2009    Orientation RESPIRATION BLADDER Height & Weight     Self, Time, Situation, Place  Normal Continent Weight: 143 lb 8 oz (65.1 kg) Height:  4\' 11"  (149.9 cm)  BEHAVIORAL SYMPTOMS/MOOD NEUROLOGICAL BOWEL NUTRITION STATUS      Continent Diet (Regular Diet )  AMBULATORY STATUS COMMUNICATION OF NEEDS Skin   Independent Verbally Normal                       Personal Care Assistance Level of Assistance  Bathing, Dressing, Feeding           Functional Limitations Info  Sight, Hearing,  Speech Sight Info: Adequate Hearing Info: Impaired Speech Info: Adequate    SPECIAL CARE FACTORS FREQUENCY  PT (By licensed PT), OT (By licensed OT)     PT Frequency: 5 OT Frequency: 5            Contractures Contractures Info: Not present    Additional Factors Info  Code Status, Allergies Code Status Info: Full Code Allergies Info: Influenza Vaccines, Niacin           Current Medications (06/23/2016):  This is the current hospital active medication list Current Facility-Administered Medications  Medication Dose Route Frequency Provider Last Rate Last Dose  . 0.9 %  sodium chloride infusion   Intravenous Continuous Belkys A Regalado, MD 75 mL/hr at 06/22/16 2154    . acetaminophen (TYLENOL) tablet 650 mg  650 mg Oral Q6H PRN Yolonda Kida, MD       Or  . acetaminophen (TYLENOL) suppository 650 mg  650 mg Rectal Q6H PRN Yolonda Kida, MD      . amLODipine (NORVASC) tablet 5 mg  5 mg Oral Daily Belkys A Regalado, MD   5 mg at 06/23/16 0926  . ceFAZolin (ANCEF) IVPB 2g/100 mL premix  2 g Intravenous Q12H Yolonda Kida, MD   2 g at 06/23/16 0248  . Chlorhexidine Gluconate Cloth 2 % PADS 6 each  6 each Topical Daily Alba Cory, MD   6 each at 06/23/16 0929  . enoxaparin (LOVENOX) injection 30 mg  30 mg  Subcutaneous Q24H Yolonda Kida, MD   30 mg at 06/23/16 0835  . hydrALAZINE (APRESOLINE) injection 5 mg  5 mg Intravenous Q4H PRN Leda Gauze, NP   5 mg at 06/22/16 0549  . HYDROcodone-acetaminophen (NORCO/VICODIN) 5-325 MG per tablet 1 tablet  1 tablet Oral Q6H PRN Merlene Laughter, DO   1 tablet at 06/23/16 0926  . HYDROmorphone (DILAUDID) injection 0.5 mg  0.5 mg Intravenous Q2H PRN Tyrone Nine, MD   0.5 mg at 06/23/16 1230  . lactated ringers infusion   Intravenous Continuous Sharee Holster, MD 10 mL/hr at 06/22/16 1426    . loratadine (CLARITIN) tablet 10 mg  10 mg Oral Daily PRN Leda Gauze, NP   10 mg at 06/22/16 2238  .  methocarbamol (ROBAXIN) tablet 500 mg  500 mg Oral Q6H PRN Yolonda Kida, MD   500 mg at 06/23/16 1610   Or  . methocarbamol (ROBAXIN) 500 mg in dextrose 5 % 50 mL IVPB  500 mg Intravenous Q6H PRN Yolonda Kida, MD      . metoCLOPramide [facility]) tablet 5 mg  5 mg Oral Q8H PRN Yolonda Kida, MD       Or  . metoCLOPramide (REGLAN) injection 5 mg  5 mg Intravenous Q8H PRN Yolonda Kida, MD      . mupirocin ointment (BACTROBAN) 2 % 1 application  1 application Nasal BID Alba Cory, MD   1 application at 06/23/16 [phone]  . ondansetron (ZOFRAN) injection 4 mg  4 mg Intravenous Q6H PRN Tyrone Nine, MD   4 mg at 06/21/16 2233  . ondansetron (ZOFRAN) tablet 4 mg  4 mg Oral Q6H PRN Yolonda Kida, MD       Or  . ondansetron [facility]) injection 4 mg  4 mg Intravenous Q6H PRN Yolonda Kida, MD      . polyethylene glycol (MIRALAX / GLYCOLAX) packet 17 g  17 g Oral Daily Belkys A Regalado, MD   17 g at 06/23/16 0928  . senna-docusate (Senokot-S) tablet 1 tablet  1 tablet Oral Daily Tyrone Nine, MD   1 tablet at 06/23/16 5409     Discharge Medications: Please see discharge summary for a list of discharge medications.  Relevant Imaging Results:  Relevant Lab Results:   Additional Information SSN:  [id number]  Dede Query, LCSW

## 2016-06-23 NOTE — Progress Notes (Signed)
   Subjective:  Patient reports pain as mild to moderate.  Had some difficulty today with getting out of bed.    Objective:   VITALS:   Vitals:   06/23/16 0239 06/23/16 0459 06/23/16 0911 06/23/16 1402  BP: 134/72 (!) 163/65 (!) 155/60 (!) 170/71  Pulse: 98 91 96 100  Resp: 16 18 18 18   Temp: 98.3 F (36.8 C) 98.2 F (36.8 C) 99 F (37.2 C) 98.6 F (37 C)  TempSrc: Oral Oral Oral Oral  SpO2: 95% 96% 94% 99%  Weight:      Height:        Neurologically intact Neurovascular intact Sensation intact distally Intact pulses distally Dorsiflexion/Plantar flexion intact Incision: dressing C/D/I   Lab Results  Component Value Date   WBC 9.5 06/23/2016   HGB 11.9 (L) 06/23/2016   HCT 35.1 (L) 06/23/2016   MCV 90.9 06/23/2016   PLT 245 06/23/2016   BMET    Component Value Date/Time   NA 135 06/23/2016 1551   K 3.5 06/23/2016 1551   CL 104 06/23/2016 1551   CO2 20 (L) 06/23/2016 1551   GLUCOSE 152 (H) 06/23/2016 1551   BUN 18 06/23/2016 1551   CREATININE 1.26 (H) 06/23/2016 1551   CALCIUM 8.3 (L) 06/23/2016 1551   GFRNONAA 38 (L) 06/23/2016 1551   GFRAA 44 (L) 06/23/2016 1551     Assessment/Plan: 1 Day Post-Op   Principal Problem:   Femoral neck fracture (HCC) Active Problems:   Osteoarthritis   Creatinine elevation   HTN (hypertension)   Diastolic dysfunction without heart failure   Leukocytosis   Up with therapy -looking like dc to rehab is possibility, she really wants to go home, will follow progress with PT -WBAT LLE, with posterior hip precautions -dc on bid asa x 1 month for dvt ppx -SCDs and lovenox while in house for dvt ppx  Yolonda Kida 06/23/2016, 9:28 PM   Maryan Rued, MD [phone]

## 2016-06-23 NOTE — Progress Notes (Signed)
Pt was sitting in the chair, x2 total assist for transfers. Pain meds given as needed.

## 2016-06-23 NOTE — Anesthesia Postprocedure Evaluation (Addendum)
Anesthesia Post Note  Patient: Cressida Kaplin  Procedure(s) Performed: Procedure(s) (LRB): ARTHROPLASTY BIPOLAR HIP (HEMIARTHROPLASTY) (Left)  Patient location during evaluation: PACU Anesthesia Type: General Level of consciousness: awake and lethargic Pain management: pain level controlled Vital Signs Assessment: post-procedure vital signs reviewed and stable Respiratory status: spontaneous breathing, nonlabored ventilation, respiratory function stable and patient connected to nasal cannula oxygen Cardiovascular status: blood pressure returned to baseline and stable Postop Assessment: no signs of nausea or vomiting Anesthetic complications: no       Last Vitals:  Vitals:   06/23/16 0239 06/23/16 0459  BP: 134/72 (!) 163/65  Pulse: 98 91  Resp: 16 18  Temp: 36.8 C 36.8 C    Last Pain:  Vitals:   06/23/16 0709  TempSrc:   PainSc: 10-Worst pain ever                 Osric Klopf,JAMES TERRILL

## 2016-06-23 NOTE — Progress Notes (Signed)
PROGRESS NOTE    Cindy Clarke  DBZ:[id number] DOB: 1931/03/31 DOA: 06/20/2016 PCP: No primary care provider on file.   Brief Narrative:  Cindy Clarke a 83 y.o.femalewith PMH of Hyperlipidemia and Osteoporosis who presented to the [facility] after a fall. She was stepping off a curb after shopping and isn't sure how, but misstepped and landed on her left side with immediate pain in her left hip. The pain is severe, nonradiating, constant, waxing and waning, worse with any movement, and only moderate when still. She denies any preceding symptoms including palpitations, chest pain, dyspnea or light-headedness. No LOC or head trauma. She was hypertensive on arrival and in severe pain in the left hip with deformity on exam. XR confirmed left femur fracture. Orthopedics consulted and planning operative intervention. TRH called to admit and patient was transferred to [facility] for Surgical Intervention on 06/22/16 and had surgery by Dr. Missy Sabins and is POD 1.    Assessment & Plan:   Principal Problem:   Femoral neck fracture (HCC) Active Problems:   Osteoarthritis   Creatinine elevation   HTN (hypertension)   Diastolic dysfunction without heart failure   Leukocytosis  Left Subcapital femoral neck fracture s/p Open Treatment of Femoral Fracture, Proximal End, Neck, Prosthetic Replacement POD1 -In setting of mechanical fall.  -Denied dyspnea or chest pain; ECHO with Normal EF of 60-65%.  -Hip X-Ray showed Acute minimally displaced and impacted left subcapital femoral neck Fracture.  -Patient underwent surgical intervention yesterday by Dr. Missy Sabins and is POD 1 -Pain management with Hydromorphone 0.5 mg po q2hprn for Severe Pain, Hydrocodone-Acetaminophen 1 tab po q6hprn for Moderate Pain, and Acetaminophen 650 mg po q6hprn for Mild Pain -C/w Robaxin 500 mg po q6hprn Muscle spasms -C/w with Zofran 4 mg IV q6hprn for Nasuea and with Metoclopramide 5 mg po q8hprn for Nausea if Zofran  ineffective -Bowel Regimen with Miralax 17 grams daily and with Senna-Docusate 1 tab po Daily -Per Ortho: Mechanical VTE Prophylaxis with SCD's and TED Thigh-High and Chemical VTE Prophylaxis with BID ASA x 1 month at D/C -Ortho Started patient on Enoxaparin 30 mg sq  -Orthopedic recommends FU in 2 weeks for Wound Check  Probably AKI on CKD -Unknown  baseline.  -Cr on admission at 1.67 and improved to 1.28 -Reduce IV Fluids with NS at 75 mL/hr to 50 mL/hr -Repeat CMP in AM  Mild Leukocytosis -Improved -Likely reactive to Pain -WBC went from 11.2 -> 9.5 -Repeat CBC in AM  HTN -BP may be elevated 2/2 to Pain -Patient was not taking medications for HTN.  -C/w Amlodipine 5 mg po Daily -C/w Hydralazine 5 mg IV q4hprn for SBP >170 -Control Pain as Above  Hyperlipidemia -Patient no longer Taking Pravastatin -Outpatient Lipid Panel and follow up with PCP  Diastolic Grade 1 Dysfunction -Not Decompensated currently -Strict I's and O's and Daily weights -Continue to monitor Volume Status Closely as patient is getting IVF  DVT prophylaxis: Heparin 5,000 sq q8h Code Status: FULL CODE Family Communication: No Family present at bedside Disposition Plan: SNF   Consultants:  - Orthopedics   Procedures:  ECHOCARDIOGRAM Study Conclusions  - Left ventricle: The cavity size was normal. Wall thickness was   increased in a pattern of mild LVH. Systolic function was normal.   The estimated ejection fraction was in the range of 60% to 65%.   Wall motion was normal; there were no regional wall motion   abnormalities. Doppler parameters are consistent with abnormal   left ventricular  relaxation (grade 1 diastolic dysfunction). - Aortic valve: Mildly calcified annulus. - Pulmonary arteries: Systolic pressure was moderately increased.   PA peak pressure: 46 mm Hg (S).  Antimicrobials: Anti-infectives    Start     Dose/Rate Route Frequency Ordered Stop   06/23/16 0300  ceFAZolin  (ANCEF) IVPB 2g/100 mL premix     2 g 200 mL/hr over 30 Minutes Intravenous Every 12 hours 06/22/16 1739 06/24/16 0259   06/22/16 0800  ceFAZolin (ANCEF) IVPB 2g/100 mL premix     2 g 200 mL/hr over 30 Minutes Intravenous To ShortStay Surgical 06/21/16 1447 06/22/16 1524     Subjective: Seen and examined and is hard of hearing. Was out of bed to chair. States pain was controlled. No nausea or vomiting. Attempting to select SNF for Rehab.   Objective: Vitals:   06/23/16 0239 06/23/16 0459 06/23/16 0911 06/23/16 1402  BP: 134/72 (!) 163/65 (!) 155/60 (!) 170/71  Pulse: 98 91 96 100  Resp: 16 18 18 18   Temp: 98.3 F (36.8 C) 98.2 F (36.8 C) 99 F (37.2 C) 98.6 F (37 C)  TempSrc: Oral Oral Oral Oral  SpO2: 95% 96% 94% 99%  Weight:      Height:        Intake/Output Summary (Last 24 hours) at 06/23/16 1452 Last data filed at 06/23/16 1444  Gross per 24 hour  Intake           4014.5 ml  Output             2000 ml  Net           2014.5 ml   Filed Weights   06/20/16 1346 06/21/16 1440  Weight: 63.5 kg (140 lb) 65.1 kg (143 lb 8 oz)   Examination: Physical Exam:  Constitutional: Elderly female was up in chair in NAD Eyes: Lids and conjunctivae normal, sclerae anicteric  ENMT: External Ears, Nose appear normal. Hard of hearing.  Neck: Appears normal, supple, no cervical masses, normal ROM, no appreciable thyromegaly, no JVD Respiratory: Clear to auscultation bilaterally, no wheezing, rales, rhonchi or crackles. Normal respiratory effort and patient is not tachypenic. No accessory muscle use.  Cardiovascular: RRR, no murmurs / rubs / gallops. S1 and S2 auscultated. No extremity edema. Abdomen: Soft, non-tender, non-distended. No masses palpated. No appreciable hepatosplenomegaly. Bowel sounds positive.  GU: Deferred. Musculoskeletal: No clubbing / cyanosis of digits/nails. Left Hip surgical incision bandaged and C/D/I Skin: No rashes, lesions, ulcers. No induration; Warm and  dry. Had skin tear on Left knee. Neurologic: CN 2-12 grossly intact with no focal deficits.Romberg sign cerebellar reflexes not assessed.  Psychiatric: Normal judgment and insight. Alert and oriented x 3. Normal mood and appropriate affect.   Data Reviewed: I have personally reviewed following labs and imaging studies  CBC:  Recent Labs Lab 06/20/16 1400 06/21/16 0445 06/22/16 0404 06/23/16 0633  WBC 10.2 12.0* 11.2* 9.5  NEUTROABS 7.7  --   --  6.3  HGB 14.5 12.9 13.2 11.9*  HCT 41.7 38.5 39.9 35.1*  MCV 88.3 90.0 91.1 90.9  PLT 328 318 306 245   Basic Metabolic Panel:  Recent Labs Lab 06/20/16 1400 06/21/16 0445 06/22/16 0404 06/23/16 0633  NA 140 139 137 135  K 4.0 3.6 3.6 3.7  CL 105 104 105 107  CO2 24 25 22  19*  GLUCOSE 102* 118* 102* 86  BUN 35* 36* 24* 17  CREATININE 1.67* 1.50* 1.32* 1.28*  CALCIUM 9.8 9.2 8.8* 8.4*  MG  --   --   --  1.8  PHOS  --   --   --  2.6   GFR: Estimated Creatinine Clearance: 27.3 mL/min (by C-G formula based on SCr of 1.28 mg/dL (H)). Liver Function Tests:  Recent Labs Lab 06/23/16 0633  AST 34  ALT 14  ALKPHOS 57  BILITOT 0.7  PROT 5.9*  ALBUMIN 3.1*   No results for input(s): LIPASE, AMYLASE in the last 168 hours. No results for input(s): AMMONIA in the last 168 hours. Coagulation Profile:  Recent Labs Lab 06/20/16 1440  INR 1.01   Cardiac Enzymes: No results for input(s): CKTOTAL, CKMB, CKMBINDEX, TROPONINI in the last 168 hours. BNP (last 3 results) No results for input(s): PROBNP in the last 8760 hours. HbA1C: No results for input(s): HGBA1C in the last 72 hours. CBG: No results for input(s): GLUCAP in the last 168 hours. Lipid Profile: No results for input(s): CHOL, HDL, LDLCALC, TRIG, CHOLHDL, LDLDIRECT in the last 72 hours. Thyroid Function Tests: No results for input(s): TSH, T4TOTAL, FREET4, T3FREE, THYROIDAB in the last 72 hours. Anemia Panel: No results for input(s): VITAMINB12, FOLATE,  FERRITIN, TIBC, IRON, RETICCTPCT in the last 72 hours. Sepsis Labs: No results for input(s): PROCALCITON, LATICACIDVEN in the last 168 hours.  Recent Results (from the past 240 hour(s))  Surgical pcr screen     Status: Abnormal   Collection Time: 06/21/16  5:00 AM  Result Value Ref Range Status   MRSA, PCR NEGATIVE NEGATIVE Final   Staphylococcus aureus POSITIVE (A) NEGATIVE Final    Comment:        The Xpert SA Assay (FDA approved for NASAL specimens in patients over 48 years of age), is one component of a comprehensive surveillance program.  Test performance has been validated by [facility] for patients greater than or equal to 17 year old. It is not intended to diagnose infection nor to guide or monitor treatment.     Radiology Studies: Dg Pelvis Portable  Result Date: 06/22/2016 CLINICAL DATA:  Hip fracture. EXAM: PORTABLE PELVIS 1-2 VIEWS COMPARISON:  Pelvis radiograph 10/05/2010 FINDINGS: There has been a 2 component left hip arthroplasty. The prosthetic femoral head is normally aligned with the acetabulum. No displaced fractures are seen. Hyperdense granules overlie the left thigh. Osteoarthritic changes of the right hip joint are noted. IMPRESSION: Post 2 component left hip arthroplasty without evidence of hardware complications. Electronically Signed   By: Ted Mcalpine M.D.   On: 06/22/2016 16:58   Scheduled Meds: . amLODipine  5 mg Oral Daily  .  ceFAZolin (ANCEF) IV  2 g Intravenous Q12H  . Chlorhexidine Gluconate Cloth  6 each Topical Daily  . enoxaparin (LOVENOX) injection  30 mg Subcutaneous Q24H  . mupirocin ointment  1 application Nasal BID  . polyethylene glycol  17 g Oral Daily  . senna-docusate  1 tablet Oral Daily   Continuous Infusions: . sodium chloride 75 mL/hr at 06/22/16 2154  . lactated ringers 10 mL/hr at 06/22/16 1426    LOS: 3 days   Merlene Laughter, DO [facility] Pager [phone]  If 7PM-7AM, please contact  night-coverage [email or web address] Password [email or web address] 06/23/2016, 2:52 PM

## 2016-06-24 DIAGNOSIS — E876 Hypokalemia: Secondary | ICD-10-CM

## 2016-06-24 LAB — CBC WITH DIFFERENTIAL/PLATELET
Basophils Absolute: 0.1 10*3/uL (ref 0.0–0.1)
Basophils Relative: 1 %
Eosinophils Absolute: 0.2 10*3/uL (ref 0.0–0.7)
Eosinophils Relative: 2 %
HEMATOCRIT: 32.9 % — AB (ref 36.0–46.0)
HEMOGLOBIN: 11.1 g/dL — AB (ref 12.0–15.0)
LYMPHS ABS: 1.8 10*3/uL (ref 0.7–4.0)
LYMPHS PCT: 17 %
MCH: 30.2 pg (ref 26.0–34.0)
MCHC: 33.7 g/dL (ref 30.0–36.0)
MCV: 89.6 fL (ref 78.0–100.0)
MONO ABS: 1.1 10*3/uL — AB (ref 0.1–1.0)
MONOS PCT: 10 %
NEUTROS ABS: 7.2 10*3/uL (ref 1.7–7.7)
Neutrophils Relative %: 70 %
Platelets: 237 10*3/uL (ref 150–400)
RBC: 3.67 MIL/uL — ABNORMAL LOW (ref 3.87–5.11)
RDW: 13.1 % (ref 11.5–15.5)
WBC: 10.4 10*3/uL (ref 4.0–10.5)

## 2016-06-24 LAB — COMPREHENSIVE METABOLIC PANEL
ALK PHOS: 60 U/L (ref 38–126)
ALT: 16 U/L (ref 14–54)
AST: 34 U/L (ref 15–41)
Albumin: 2.8 g/dL — ABNORMAL LOW (ref 3.5–5.0)
Anion gap: 11 (ref 5–15)
BUN: 17 mg/dL (ref 6–20)
CALCIUM: 8.4 mg/dL — AB (ref 8.9–10.3)
CO2: 20 mmol/L — ABNORMAL LOW (ref 22–32)
Chloride: 106 mmol/L (ref 101–111)
Creatinine, Ser: 1.18 mg/dL — ABNORMAL HIGH (ref 0.44–1.00)
GFR, EST AFRICAN AMERICAN: 48 mL/min — AB (ref >60)
GFR, EST NON AFRICAN AMERICAN: 41 mL/min — AB (ref >60)
Glucose, Bld: 108 mg/dL — ABNORMAL HIGH (ref 65–99)
Potassium: 3.3 mmol/L — ABNORMAL LOW (ref 3.5–5.1)
Sodium: 137 mmol/L (ref 135–145)
Total Bilirubin: 0.9 mg/dL (ref 0.3–1.2)
Total Protein: 5.8 g/dL — ABNORMAL LOW (ref 6.5–8.1)

## 2016-06-24 LAB — PHOSPHORUS: Phosphorus: 2.4 mg/dL — ABNORMAL LOW (ref 2.5–4.6)

## 2016-06-24 LAB — MAGNESIUM: Magnesium: 1.8 mg/dL (ref 1.7–2.4)

## 2016-06-24 MED ORDER — POTASSIUM CHLORIDE CRYS ER 20 MEQ PO TBCR
40.0000 meq | EXTENDED_RELEASE_TABLET | Freq: Two times a day (BID) | ORAL | Status: DC
  Administered 2016-06-24 – 2016-06-25 (×2): 40 meq via ORAL
  Filled 2016-06-24 (×2): qty 2

## 2016-06-24 MED ORDER — AMLODIPINE BESYLATE 10 MG PO TABS
10.0000 mg | ORAL_TABLET | Freq: Every day | ORAL | Status: DC
  Administered 2016-06-24 – 2016-06-29 (×6): 10 mg via ORAL
  Filled 2016-06-24 (×6): qty 1

## 2016-06-24 NOTE — Progress Notes (Signed)
Physical Therapy Treatment Patient Details Name: Cindy Clarke MRN: [id number] DOB: 1930/08/29 Today's Date: 06/24/2016    History of Present Illness 83 y.o. female admitted with L hip fx, s/p bipolar hip hemiarthroplasty. PMH of anxiety, depression, OA.     PT Comments    Patient continues to require +2 assist for all mobility however did appear to be a little less pain this session. Current plan remains appropriate.   Follow Up Recommendations  SNF;Supervision/Assistance - 24 hour     Equipment Recommendations  Rolling walker with 5" wheels    Recommendations for Other Services OT consult     Precautions / Restrictions Precautions Precautions: Fall;Posterior Hip Precaution Comments: reviewed precautions; pt able to recall 2/3 and third with cues; has handout posted in room Restrictions Weight Bearing Restrictions: Yes LLE Weight Bearing: Weight bearing as tolerated    Mobility  Bed Mobility Overal bed mobility: Needs Assistance Bed Mobility: Supine to Sit     Supine to sit: Total assist;+2 for physical assistance;HOB elevated     General bed mobility comments: assist for all aspects of bed mobility; cues for hand placement and sequencing; "helicopter" technique wiht bed pad used to sit EOB   Transfers Overall transfer level: Needs assistance Equipment used: Rolling walker (2 wheeled) Transfers: Sit to/from [employer] Sit to Stand: Max assist;+2 physical assistance Stand pivot transfers: Max assist;+2 physical assistance       General transfer comment: max multimodal cues for hand placement and technique to stand with hand over hand assistance required for safe use of AD; assistance to maintain posture although pt unable to achieve upright posture, to weight shift, and to manage RW; pt able to take a few steps forward and then pivotal steps to recliner  Ambulation/Gait                 Stairs            Wheelchair Mobility     Modified Rankin (Stroke Patients Only)       Balance                                    Cognition Arousal/Alertness: Awake/alert Behavior During Therapy: WFL for tasks assessed/performed;Anxious Overall Cognitive Status: No family/caregiver present to determine baseline cognitive functioning Area of Impairment: Following commands;Safety/judgement;Problem solving       Following Commands: Follows one step commands inconsistently;Follows one step commands with increased time Safety/Judgement: Decreased awareness of safety   Problem Solving: Requires verbal cues;Requires tactile cues      Exercises      General Comments        Pertinent Vitals/Pain Pain Assessment: Faces Faces Pain Scale: Hurts whole lot Pain Location: L hip Pain Descriptors / Indicators: Sore;Grimacing;Guarding;Moaning;Tightness Pain Intervention(s): Limited activity within patient's tolerance;Monitored during session;Premedicated before session;Repositioned    Home Living                      Prior Function            PT Goals (current goals can now be found in the care plan section) Acute Rehab PT Goals Patient Stated Goal: stay in bed Progress towards PT goals: Progressing toward goals    Frequency    Min 3X/week      PT Plan Current plan remains appropriate    Co-evaluation             End  of Session Equipment Utilized During Treatment: Gait belt Activity Tolerance: Patient limited by pain;Patient limited by fatigue Patient left: in chair;with call bell/phone within reach     Time: 1017-1037 PT Time Calculation (min) (ACUTE ONLY): 20 min  Charges:  $Therapeutic Activity: 8-22 mins                    G Codes:      Derek Mound, PTA Pager: [phone]  06/24/2016, 11:52 AM

## 2016-06-24 NOTE — Progress Notes (Signed)
Patient ID: Cindy Clarke, female   DOB: 1930/11/01, 83 y.o.   MRN: [id number] Subjective: 2 Days Post-Op Procedure(s) (LRB): ARTHROPLASTY BIPOLAR HIP (HEMIARTHROPLASTY) (Left)    Patient reports pain as mild to moderate.  Working on disposition as she lives in [place].  If placed in SNF in [place] may be easier to get follow up with [employer] versus driving back up her for routine post-op visits.  Of course if around here needs follow with Dr. Aundria Rud in 2 weeks  Objective:   VITALS:   Vitals:   06/23/16 2141 06/24/16 0433  BP: (!) 163/60 (!) 173/74  Pulse: 92 89  Resp: 18 18  Temp: 98.4 F (36.9 C) 98.2 F (36.8 C)    Neurovascular intact Incision: dressing C/D/I  LABS  Recent Labs  06/22/16 0404 06/23/16 0633 06/24/16 0528  HGB 13.2 11.9* 11.1*  HCT 39.9 35.1* 32.9*  WBC 11.2* 9.5 10.4  PLT 306 245 237     Recent Labs  06/23/16 0633 06/23/16 1551 06/24/16 0528  NA 135 135 137  K 3.7 3.5 3.3*  BUN 17 18 17   CREATININE 1.28* 1.26* 1.18*  GLUCOSE 86 152* 108*    No results for input(s): LABPT, INR in the last 72 hours.   Assessment/Plan: 2 Days Post-Op Procedure(s) (LRB): ARTHROPLASTY BIPOLAR HIP (HEMIARTHROPLASTY) (Left)   Up with therapy Discharge to SNF, pending  ASA 81mg  BID for DVT prophylaxis WBAT LLE RTC in 2 weeks

## 2016-06-24 NOTE — Progress Notes (Signed)
PROGRESS NOTE    Cindy Clarke  ZOX:[id number] DOB: 1930-08-16 DOA: 06/20/2016 PCP: No primary care provider on file.   Brief Narrative:  Cindy Clarke a 83 y.o.femalewith PMH of Hyperlipidemia and Osteoporosis who presented to the [facility] after a fall. She was stepping off a curb after shopping and isn't sure how, but misstepped and landed on her left side with immediate pain in her left hip. The pain is severe, nonradiating, constant, waxing and waning, worse with any movement, and only moderate when still. She denies any preceding symptoms including palpitations, chest pain, dyspnea or light-headedness. No LOC or head trauma. She was hypertensive on arrival and in severe pain in the left hip with deformity on exam. XR confirmed left femur fracture. Orthopedics consulted and planning operative intervention. TRH called to admit and patient was transferred to [facility] for Surgical Intervention on 06/22/16 and had surgery by Dr. Missy Sabins and is POD 2.    Assessment & Plan:   Principal Problem:   Femoral neck fracture (HCC) Active Problems:   Osteoarthritis   Creatinine elevation   HTN (hypertension)   Diastolic dysfunction without heart failure   Leukocytosis  Left Subcapital femoral neck fracture s/p Open Treatment of Femoral Fracture, Proximal End, Neck, Prosthetic Replacement POD2 -In setting of mechanical fall.  -Denied dyspnea or chest pain; ECHO with Normal EF of 60-65%.  -Hip X-Ray showed Acute minimally displaced and impacted left subcapital femoral neck Fracture.  -Patient underwent surgical intervention yesterday by Dr. Missy Sabins and is POD 1 -Pain management with Hydromorphone 0.5 mg po q2hprn for Severe Pain, Hydrocodone-Acetaminophen 1 tab po q6hprn for Moderate Pain, and Acetaminophen 650 mg po q6hprn for Mild Pain -C/w Robaxin 500 mg po q6hprn Muscle spasms -C/w with Zofran 4 mg IV q6hprn for Nasuea and with Metoclopramide 5 mg po q8hprn for Nausea if Zofran  ineffective -Bowel Regimen with Miralax 17 grams daily and with Senna-Docusate 1 tab po Daily -Per Ortho: Mechanical VTE Prophylaxis with SCD's and TED Thigh-High and Chemical VTE Prophylaxis with BID ASA 81 mg for DVT x 1 month at D/C -Ortho Started patient on Enoxaparin 30 mg sq while in the Hospital -Orthopedic recommends FU in 2 weeks for Wound Check; May follow up with [facility] if easier   Probably AKI on CKD -Unknown  baseline.  -Cr on admission at 1.67 and improved to 1.18 -D/C IV Fluids with NS at 50 mL/hr  -Repeat CMP in AM  Hypokalemia -Patient's K+ was 3.3 this AM -Replete with Kcl 40 mEq po BID -Repeat CMP in AM  Mild Leukocytosis -Improved -Likely reactive to Pain -WBC went from 11.2 -> 9.5 -> 10.4 -Repeat CBC in AM  HTN -BP may be elevated 2/2 to Pain -Patient was not taking medications for HTN.  -C/w Amlodipine 10 mg po Daily -C/w Hydralazine 5 mg IV q4hprn for SBP >170 -Control Pain as Above  Hyperlipidemia -Patient no longer Taking Pravastatin -Outpatient Lipid Panel and follow up with PCP  Diastolic Grade 1 Dysfunction -Not Decompensated currently -Strict I's and O's and Daily weights -Continue to monitor Volume Status Closely -D/C'd IVF now  DVT prophylaxis: Lovenox 30 mg sq q24h Code Status: FULL CODE Family Communication: No Family present at bedside Disposition Plan: SNF   Consultants:  - Orthopedics   Procedures:  ECHOCARDIOGRAM Study Conclusions  - Left ventricle: The cavity size was normal. Wall thickness was   increased in a pattern of mild LVH. Systolic function was normal.   The estimated ejection  fraction was in the range of 60% to 65%.   Wall motion was normal; there were no regional wall motion   abnormalities. Doppler parameters are consistent with abnormal   left ventricular relaxation (grade 1 diastolic dysfunction). - Aortic valve: Mildly calcified annulus. - Pulmonary arteries: Systolic pressure was  moderately increased.   PA peak pressure: 46 mm Hg (S).  Antimicrobials: Anti-infectives    Start     Dose/Rate Route Frequency Ordered Stop   06/23/16 0300  ceFAZolin (ANCEF) IVPB 2g/100 mL premix     2 g 200 mL/hr over 30 Minutes Intravenous Every 12 hours 06/22/16 1739 06/23/16 1601   06/22/16 0800  ceFAZolin (ANCEF) IVPB 2g/100 mL premix     2 g 200 mL/hr over 30 Minutes Intravenous To ShortStay Surgical 06/21/16 1447 06/22/16 1524     Subjective: Seen and examined and is hard of hearing. States she is doing better every day and feels a little stronger. Wants to go to SNF in [place]. No CP or SOB. No complaints overnight.   Objective: Vitals:   06/23/16 1402 06/23/16 2141 06/24/16 0433 06/24/16 1300  BP: (!) 170/71 (!) 163/60 (!) 173/74 (!) 166/39  Pulse: 100 92 89 92  Resp: 18 18 18 18   Temp: 98.6 F (37 C) 98.4 F (36.9 C) 98.2 F (36.8 C) 97.9 F (36.6 C)  TempSrc: Oral Oral Oral Oral  SpO2: 99% 95% 96% 97%  Weight:      Height:        Intake/Output Summary (Last 24 hours) at 06/24/16 1758 Last data filed at 06/24/16 1713  Gross per 24 hour  Intake          3092.58 ml  Output             2275 ml  Net           817.58 ml   Filed Weights   06/20/16 1346 06/21/16 1440  Weight: 63.5 kg (140 lb) 65.1 kg (143 lb 8 oz)   Examination: Physical Exam:  Constitutional: Elderly female who is in NAD Eyes: Lids and conjunctivae normal, sclerae anicteric  ENMT: External Ears, Nose appear normal. Hard of hearing.  Neck: Appears normal, supple, no cervical masses, normal ROM, no appreciable thyromegaly, no JVD Respiratory: Clear to auscultation bilaterally, no wheezing, rales, rhonchi or crackles. Normal respiratory effort and patient is not tachypenic. No accessory muscle use.  Cardiovascular: RRR, no murmurs / rubs / gallops. S1 and S2 auscultated. No extremity edema. Abdomen: Soft, non-tender, non-distended. No masses palpated. No appreciable hepatosplenomegaly. Bowel  sounds positive.  GU: Deferred. Musculoskeletal: No clubbing / cyanosis of digits/nails. Skin: No rashes, lesions, ulcers. No induration; Warm and dry. Had skin tear on Left knee. Neurologic: CN 2-12 grossly intact with no focal deficits.Romberg sign cerebellar reflexes not assessed.  Psychiatric: Normal judgment and insight. Alert and oriented x 3. Normal mood and appropriate affect.   Data Reviewed: I have personally reviewed following labs and imaging studies  CBC:  Recent Labs Lab 06/20/16 1400 06/21/16 0445 06/22/16 0404 06/23/16 0633 06/24/16 0528  WBC 10.2 12.0* 11.2* 9.5 10.4  NEUTROABS 7.7  --   --  6.3 7.2  HGB 14.5 12.9 13.2 11.9* 11.1*  HCT 41.7 38.5 39.9 35.1* 32.9*  MCV 88.3 90.0 91.1 90.9 89.6  PLT 328 318 306 245 237   Basic Metabolic Panel:  Recent Labs Lab 06/21/16 0445 06/22/16 0404 06/23/16 0633 06/23/16 1551 06/24/16 0528  NA 139 137 135 135 137  K  3.6 3.6 3.7 3.5 3.3*  CL 104 105 107 104 106  CO2 25 22 19* 20* 20*  GLUCOSE 118* 102* 86 152* 108*  BUN 36* 24* 17 18 17   CREATININE 1.50* 1.32* 1.28* 1.26* 1.18*  CALCIUM 9.2 8.8* 8.4* 8.3* 8.4*  MG  --   --  1.8  --  1.8  PHOS  --   --  2.6  --  2.4*   GFR: Estimated Creatinine Clearance: 29.7 mL/min (by C-G formula based on SCr of 1.18 mg/dL (H)). Liver Function Tests:  Recent Labs Lab 06/23/16 0633 06/23/16 1551 06/24/16 0528  AST 34 39 34  ALT 14 16 16   ALKPHOS 57 61 60  BILITOT 0.7 0.5 0.9  PROT 5.9* 6.0* 5.8*  ALBUMIN 3.1* 3.0* 2.8*   No results for input(s): LIPASE, AMYLASE in the last 168 hours. No results for input(s): AMMONIA in the last 168 hours. Coagulation Profile:  Recent Labs Lab 06/20/16 1440  INR 1.01   Cardiac Enzymes: No results for input(s): CKTOTAL, CKMB, CKMBINDEX, TROPONINI in the last 168 hours. BNP (last 3 results) No results for input(s): PROBNP in the last 8760 hours. HbA1C: No results for input(s): HGBA1C in the last 72 hours. CBG: No results  for input(s): GLUCAP in the last 168 hours. Lipid Profile: No results for input(s): CHOL, HDL, LDLCALC, TRIG, CHOLHDL, LDLDIRECT in the last 72 hours. Thyroid Function Tests: No results for input(s): TSH, T4TOTAL, FREET4, T3FREE, THYROIDAB in the last 72 hours. Anemia Panel: No results for input(s): VITAMINB12, FOLATE, FERRITIN, TIBC, IRON, RETICCTPCT in the last 72 hours. Sepsis Labs: No results for input(s): PROCALCITON, LATICACIDVEN in the last 168 hours.  Recent Results (from the past 240 hour(s))  Surgical pcr screen     Status: Abnormal   Collection Time: 06/21/16  5:00 AM  Result Value Ref Range Status   MRSA, PCR NEGATIVE NEGATIVE Final   Staphylococcus aureus POSITIVE (A) NEGATIVE Final    Comment:        The Xpert SA Assay (FDA approved for NASAL specimens in patients over 53 years of age), is one component of a comprehensive surveillance program.  Test performance has been validated by [facility] for patients greater than or equal to 46 year old. It is not intended to diagnose infection nor to guide or monitor treatment.     Radiology Studies: No results found. Scheduled Meds: . amLODipine  10 mg Oral Daily  . Chlorhexidine Gluconate Cloth  6 each Topical Daily  . enoxaparin (LOVENOX) injection  30 mg Subcutaneous Q24H  . mupirocin ointment  1 application Nasal BID  . polyethylene glycol  17 g Oral Daily  . senna-docusate  1 tablet Oral Daily   Continuous Infusions: . sodium chloride 50 mL/hr at 06/24/16 1144  . lactated ringers 10 mL/hr at 06/22/16 1426    LOS: 4 days   Merlene Laughter, DO [facility] Pager [phone]  If 7PM-7AM, please contact night-coverage [email or web address] Password [facility] 06/24/2016, 5:58 PM

## 2016-06-25 LAB — CBC WITH DIFFERENTIAL/PLATELET
Basophils Absolute: 0 10*3/uL (ref 0.0–0.1)
Basophils Relative: 0 %
EOS ABS: 0.2 10*3/uL (ref 0.0–0.7)
EOS PCT: 2 %
HCT: 31.7 % — ABNORMAL LOW (ref 36.0–46.0)
Hemoglobin: 10.7 g/dL — ABNORMAL LOW (ref 12.0–15.0)
LYMPHS ABS: 1.3 10*3/uL (ref 0.7–4.0)
Lymphocytes Relative: 12 %
MCH: 30.1 pg (ref 26.0–34.0)
MCHC: 33.8 g/dL (ref 30.0–36.0)
MCV: 89 fL (ref 78.0–100.0)
MONO ABS: 1.6 10*3/uL — AB (ref 0.1–1.0)
MONOS PCT: 15 %
Neutro Abs: 7.7 10*3/uL (ref 1.7–7.7)
Neutrophils Relative %: 71 %
PLATELETS: 262 10*3/uL (ref 150–400)
RBC: 3.56 MIL/uL — ABNORMAL LOW (ref 3.87–5.11)
RDW: 12.9 % (ref 11.5–15.5)
WBC: 10.9 10*3/uL — AB (ref 4.0–10.5)

## 2016-06-25 LAB — COMPREHENSIVE METABOLIC PANEL
ALT: 22 U/L (ref 14–54)
ANION GAP: 7 (ref 5–15)
AST: 35 U/L (ref 15–41)
Albumin: 2.5 g/dL — ABNORMAL LOW (ref 3.5–5.0)
Alkaline Phosphatase: 68 U/L (ref 38–126)
BUN: 20 mg/dL (ref 6–20)
CHLORIDE: 107 mmol/L (ref 101–111)
CO2: 22 mmol/L (ref 22–32)
Calcium: 8.4 mg/dL — ABNORMAL LOW (ref 8.9–10.3)
Creatinine, Ser: 1.25 mg/dL — ABNORMAL HIGH (ref 0.44–1.00)
GFR calc non Af Amer: 39 mL/min — ABNORMAL LOW (ref >60)
GFR, EST AFRICAN AMERICAN: 45 mL/min — AB (ref >60)
Glucose, Bld: 109 mg/dL — ABNORMAL HIGH (ref 65–99)
Potassium: 3.7 mmol/L (ref 3.5–5.1)
SODIUM: 136 mmol/L (ref 135–145)
Total Bilirubin: 0.7 mg/dL (ref 0.3–1.2)
Total Protein: 5.8 g/dL — ABNORMAL LOW (ref 6.5–8.1)

## 2016-06-25 LAB — PHOSPHORUS: PHOSPHORUS: 2.3 mg/dL — AB (ref 2.5–4.6)

## 2016-06-25 LAB — MAGNESIUM: Magnesium: 1.9 mg/dL (ref 1.7–2.4)

## 2016-06-25 MED ORDER — HYDRALAZINE HCL 20 MG/ML IJ SOLN: 10.0000 mg | INTRAMUSCULAR | Status: DC | PRN

## 2016-06-25 NOTE — Clinical Social Work Note (Signed)
CSW spoke with pt at bedside. Pt wants to go to [facility] in [place]. Pt gave CSW contact information to reach Child psychotherapist at [employer]. CSW attempted to contact social worker however was unable to reach anyone. Will continue to try to reach someone.   [address] [place] [phone]

## 2016-06-25 NOTE — Progress Notes (Signed)
PROGRESS NOTE    Cindy Clarke  ZOX:[id number] DOB: 1930/06/14 DOA: 06/20/2016 PCP: No primary care provider on file.   Brief Narrative:  Cindy Clarke a 83 y.o.femalewith PMH of Hyperlipidemia and Osteoporosis who presented to the [facility] after a fall. She was stepping off a curb after shopping and isn't sure how, but misstepped and landed on her left side with immediate pain in her left hip. The pain is severe, nonradiating, constant, waxing and waning, worse with any movement, and only moderate when still. She denies any preceding symptoms including palpitations, chest pain, dyspnea or light-headedness. No LOC or head trauma. She was hypertensive on arrival and in severe pain in the left hip with deformity on exam. XR confirmed left femur fracture. Orthopedics consulted and planning operative intervention. TRH called to admit and patient was transferred to [facility] for Surgical Intervention on 06/22/16 and had surgery by Dr. Missy Sabins and is POD 3. PT/OT recommending SNF and awaiting placement. CSW attempting to contact social work at [employer].    Assessment & Plan:   Principal Problem:   Femoral neck fracture (HCC) Active Problems:   Osteoarthritis   Creatinine elevation   HTN (hypertension)   Diastolic dysfunction without heart failure   Leukocytosis  Left Subcapital femoral neck fracture s/p Open Treatment of Femoral Fracture, Proximal End, Neck, Prosthetic Replacement POD3 -In setting of mechanical fall.  -Denied dyspnea or chest pain; ECHO with Normal EF of 60-65%.  -Hip X-Ray showed Acute minimally displaced and impacted left subcapital femoral neck Fracture.  -Patient underwent surgical intervention yesterday by Dr. Missy Sabins and is POD 3 -C/w PT/OT  -Pain management with Hydromorphone 0.5 mg po q2hprn for Severe Pain, Hydrocodone-Acetaminophen 1 tab po q6hprn for Moderate Pain, and Acetaminophen 650 mg po q6hprn for Mild Pain -C/w Robaxin 500 mg po q6hprn  Muscle spasms -C/w with Zofran 4 mg IV q6hprn for Nasuea and with Metoclopramide 5 mg po q8hprn for Nausea if Zofran ineffective -Bowel Regimen with Miralax 17 grams daily and with Senna-Docusate 1 tab po Daily -Per Ortho: Mechanical VTE Prophylaxis with SCD's and TED Thigh-High and Chemical VTE Prophylaxis with BID ASA 81 mg for DVT x 1 month at D/C -Ortho Started patient on Enoxaparin 30 mg sq while in the Hospital -Orthopedic recommends FU in 2 weeks for Wound Check; May follow up with [facility] if easier   Probably AKI on CKD -Unknown baseline.  -Cr on admission at 1.67 and improved to 1.25; Likely now at Baseline -D/C'd IV Fluids with NS at 50 mL/hr  -Repeat CMP in AM  Hypokalemia -Patient's K+ was 3.3 yesterday and now Improved to 3.7  -Replete with Kcl 40 mEq po BID -Repeat CMP in AM  Mild Leukocytosis -Likely reactive to Pain -WBC went from 11.2 -> 9.5 -> 10.4 -> 10.9 -No Current S/Sx of Infection -Repeat CBC in AM  HTN -BP may be elevated 2/2 to Pain -Patient was not taking medications for HTN.  -C/w Amlodipine 10 mg po Daily -Increased Hydralazine 5 mg to 10 mg IV q4hprn for SBP >170 -Control Pain as Above  Hyperlipidemia -Patient no longer Taking Pravastatin -Outpatient Lipid Panel and follow up with PCP  Diastolic Grade 1 Dysfunction -Not Decompensated currently -Strict I's and O's and Daily weights -Continue to monitor Volume Status Closely -D/C'd IVF now  DVT prophylaxis: Lovenox 30 mg sq q24h Code Status: FULL CODE Family Communication: No Family present at bedside Disposition Plan: SNF   Consultants:  - Orthopedics  Procedures:  ECHOCARDIOGRAM Study Conclusions  - Left ventricle: The cavity size was normal. Wall thickness was   increased in a pattern of mild LVH. Systolic function was normal.   The estimated ejection fraction was in the range of 60% to 65%.   Wall motion was normal; there were no regional wall motion    abnormalities. Doppler parameters are consistent with abnormal   left ventricular relaxation (grade 1 diastolic dysfunction). - Aortic valve: Mildly calcified annulus. - Pulmonary arteries: Systolic pressure was moderately increased.   PA peak pressure: 46 mm Hg (S).   3 Days Post-Op Procedure(s) (LRB): ARTHROPLASTY BIPOLAR HIP (HEMIARTHROPLASTY) (Left)   Antimicrobials: Anti-infectives    Start     Dose/Rate Route Frequency Ordered Stop   06/23/16 0300  ceFAZolin (ANCEF) IVPB 2g/100 mL premix     2 g 200 mL/hr over 30 Minutes Intravenous Every 12 hours 06/22/16 1739 06/23/16 1601   06/22/16 0800  ceFAZolin (ANCEF) IVPB 2g/100 mL premix     2 g 200 mL/hr over 30 Minutes Intravenous To ShortStay Surgical 06/21/16 1447 06/22/16 1524     Subjective: Seen and examined and is hard of hearing. No complaints this AM except that she dropped her breakfast on the floor. States hip is doing ok but still very hard for her to ambulate around and needs a lot of assistance.   Objective: Vitals:   06/24/16 1300 06/24/16 2030 06/25/16 0624 06/25/16 1359  BP: (!) 166/39 (!) 160/55 (!) 155/65 (!) 163/57  Pulse: 92 99 99 88  Resp: 18 17 19 18   Temp: 97.9 F (36.6 C) 98 F (36.7 C) 97.4 F (36.3 C) 97.9 F (36.6 C)  TempSrc: Oral Oral Oral Oral  SpO2: 97% 97% 98% 98%  Weight:      Height:        Intake/Output Summary (Last 24 hours) at 06/25/16 1432 Last data filed at 06/25/16 1400  Gross per 24 hour  Intake          3156.58 ml  Output              751 ml  Net          2405.58 ml   Filed Weights   06/20/16 1346 06/21/16 1440  Weight: 63.5 kg (140 lb) 65.1 kg (143 lb 8 oz)   Examination: Physical Exam:  Constitutional: Elderly female who is in NAD Eyes: Lids and conjunctivae normal, sclerae anicteric  ENMT: External Ears, Nose appear normal. Hard of hearing.  Neck: Appears normal, supple, no cervical masses, normal ROM, no appreciable thyromegaly, no JVD Respiratory: Clear to  auscultation bilaterally, no wheezing, rales, rhonchi or crackles. Normal respiratory effort and patient is not tachypenic. No accessory muscle use.  Cardiovascular: RRR, no murmurs / rubs / gallops. S1 and S2 auscultated. No extremity edema. Abdomen: Soft, non-tender, non-distended. No masses palpated. No appreciable hepatosplenomegaly. Bowel sounds positive.  GU: Deferred. Musculoskeletal: No clubbing / cyanosis of digits/nails. Skin: No rashes, lesions, ulcers. No induration; Warm and dry. Had skin tear on Left knee. Neurologic: CN 2-12 grossly intact with no focal deficits.Romberg sign cerebellar reflexes not assessed.  Psychiatric: Normal judgment and insight. Alert and oriented x 3. Normal mood and appropriate affect.   Data Reviewed: I have personally reviewed following labs and imaging studies  CBC:  Recent Labs Lab 06/20/16 1400 06/21/16 0445 06/22/16 0404 06/23/16 0633 06/24/16 0528 06/25/16 0404  WBC 10.2 12.0* 11.2* 9.5 10.4 10.9*  NEUTROABS 7.7  --   --  6.3 7.2 7.7  HGB 14.5 12.9 13.2 11.9* 11.1* 10.7*  HCT 41.7 38.5 39.9 35.1* 32.9* 31.7*  MCV 88.3 90.0 91.1 90.9 89.6 89.0  PLT 328 318 306 245 237 262   Basic Metabolic Panel:  Recent Labs Lab 06/22/16 0404 06/23/16 0633 06/23/16 1551 06/24/16 0528 06/25/16 0404  NA 137 135 135 137 136  K 3.6 3.7 3.5 3.3* 3.7  CL 105 107 104 106 107  CO2 22 19* 20* 20* 22  GLUCOSE 102* 86 152* 108* 109*  BUN 24* 17 18 17 20   CREATININE 1.32* 1.28* 1.26* 1.18* 1.25*  CALCIUM 8.8* 8.4* 8.3* 8.4* 8.4*  MG  --  1.8  --  1.8 1.9  PHOS  --  2.6  --  2.4* 2.3*   GFR: Estimated Creatinine Clearance: 28 mL/min (by C-G formula based on SCr of 1.25 mg/dL (H)). Liver Function Tests:  Recent Labs Lab 06/23/16 0633 06/23/16 1551 06/24/16 0528 06/25/16 0404  AST 34 39 34 35  ALT 14 16 16 22   ALKPHOS 57 61 60 68  BILITOT 0.7 0.5 0.9 0.7  PROT 5.9* 6.0* 5.8* 5.8*  ALBUMIN 3.1* 3.0* 2.8* 2.5*   No results for input(s):  LIPASE, AMYLASE in the last 168 hours. No results for input(s): AMMONIA in the last 168 hours. Coagulation Profile:  Recent Labs Lab 06/20/16 1440  INR 1.01   Cardiac Enzymes: No results for input(s): CKTOTAL, CKMB, CKMBINDEX, TROPONINI in the last 168 hours. BNP (last 3 results) No results for input(s): PROBNP in the last 8760 hours. HbA1C: No results for input(s): HGBA1C in the last 72 hours. CBG: No results for input(s): GLUCAP in the last 168 hours. Lipid Profile: No results for input(s): CHOL, HDL, LDLCALC, TRIG, CHOLHDL, LDLDIRECT in the last 72 hours. Thyroid Function Tests: No results for input(s): TSH, T4TOTAL, FREET4, T3FREE, THYROIDAB in the last 72 hours. Anemia Panel: No results for input(s): VITAMINB12, FOLATE, FERRITIN, TIBC, IRON, RETICCTPCT in the last 72 hours. Sepsis Labs: No results for input(s): PROCALCITON, LATICACIDVEN in the last 168 hours.  Recent Results (from the past 240 hour(s))  Surgical pcr screen     Status: Abnormal   Collection Time: 06/21/16  5:00 AM  Result Value Ref Range Status   MRSA, PCR NEGATIVE NEGATIVE Final   Staphylococcus aureus POSITIVE (A) NEGATIVE Final    Comment:        The Xpert SA Assay (FDA approved for NASAL specimens in patients over 45 years of age), is one component of a comprehensive surveillance program.  Test performance has been validated by [facility] for patients greater than or equal to 61 year old. It is not intended to diagnose infection nor to guide or monitor treatment.     Radiology Studies: No results found. Scheduled Meds: . amLODipine  10 mg Oral Daily  . Chlorhexidine Gluconate Cloth  6 each Topical Daily  . enoxaparin (LOVENOX) injection  30 mg Subcutaneous Q24H  . mupirocin ointment  1 application Nasal BID  . polyethylene glycol  17 g Oral Daily  . potassium chloride  40 mEq Oral BID  . senna-docusate  1 tablet Oral Daily   Continuous Infusions: . lactated ringers 10 mL/hr at  06/22/16 1426    LOS: 5 days   Merlene Laughter, DO [facility] Pager [phone]  If 7PM-7AM, please contact night-coverage [email or web address] Password [email or web address] 06/25/2016, 2:32 PM

## 2016-06-25 NOTE — Progress Notes (Signed)
   Subjective: 3 Days Post-Op Procedure(s) (LRB): ARTHROPLASTY BIPOLAR HIP (HEMIARTHROPLASTY) (Left)  C/o mild to moderate pain in the left hip/leg today Working on d/c to SNF in [place] Denies any new symptoms or issues Patient reports pain as moderate.  Objective:   VITALS:   Vitals:   06/24/16 2030 06/25/16 0624  BP: (!) 160/55 (!) 155/65  Pulse: 99 99  Resp: 17 19  Temp: 98 F (36.7 C) 97.4 F (36.3 C)    Left hip incision healing well nv intact distally No rashes or edema distally  LABS  Recent Labs  06/23/16 0633 06/24/16 0528 06/25/16 0404  HGB 11.9* 11.1* 10.7*  HCT 35.1* 32.9* 31.7*  WBC 9.5 10.4 10.9*  PLT 245 237 262     Recent Labs  06/23/16 1551 06/24/16 0528 06/25/16 0404  NA 135 137 136  K 3.5 3.3* 3.7  BUN 18 17 20   CREATININE 1.26* 1.18* 1.25*  GLUCOSE 152* 108* 109*     Assessment/Plan: 3 Days Post-Op Procedure(s) (LRB): ARTHROPLASTY BIPOLAR HIP (HEMIARTHROPLASTY) (Left) Continue PT/OT as able Pain management D/c planning to SNF in [place], MPAS, PA-C  06/25/2016, 8:41 AM

## 2016-06-26 ENCOUNTER — Inpatient Hospital Stay (HOSPITAL_COMMUNITY): Payer: Medicare HMO

## 2016-06-26 DIAGNOSIS — R05 Cough: Secondary | ICD-10-CM

## 2016-06-26 LAB — CBC WITH DIFFERENTIAL/PLATELET
BASOS ABS: 0.1 10*3/uL (ref 0.0–0.1)
Basophils Relative: 1 %
Eosinophils Absolute: 0.5 10*3/uL (ref 0.0–0.7)
Eosinophils Relative: 5 %
HEMATOCRIT: 33.4 % — AB (ref 36.0–46.0)
Hemoglobin: 11.2 g/dL — ABNORMAL LOW (ref 12.0–15.0)
LYMPHS ABS: 2.2 10*3/uL (ref 0.7–4.0)
LYMPHS PCT: 21 %
MCH: 30.3 pg (ref 26.0–34.0)
MCHC: 33.5 g/dL (ref 30.0–36.0)
MCV: 90.3 fL (ref 78.0–100.0)
MONO ABS: 1.3 10*3/uL — AB (ref 0.1–1.0)
Monocytes Relative: 12 %
NEUTROS ABS: 6.3 10*3/uL (ref 1.7–7.7)
Neutrophils Relative %: 61 %
Platelets: 290 10*3/uL (ref 150–400)
RBC: 3.7 MIL/uL — AB (ref 3.87–5.11)
RDW: 13 % (ref 11.5–15.5)
WBC: 10.3 10*3/uL (ref 4.0–10.5)

## 2016-06-26 LAB — COMPREHENSIVE METABOLIC PANEL
ALT: 17 U/L (ref 14–54)
AST: 25 U/L (ref 15–41)
Albumin: 2.6 g/dL — ABNORMAL LOW (ref 3.5–5.0)
Alkaline Phosphatase: 69 U/L (ref 38–126)
Anion gap: 8 (ref 5–15)
BILIRUBIN TOTAL: 0.5 mg/dL (ref 0.3–1.2)
BUN: 23 mg/dL — AB (ref 6–20)
CO2: 22 mmol/L (ref 22–32)
CREATININE: 1.21 mg/dL — AB (ref 0.44–1.00)
Calcium: 8.9 mg/dL (ref 8.9–10.3)
Chloride: 106 mmol/L (ref 101–111)
GFR calc Af Amer: 47 mL/min — ABNORMAL LOW (ref >60)
GFR, EST NON AFRICAN AMERICAN: 40 mL/min — AB (ref >60)
Glucose, Bld: 96 mg/dL (ref 65–99)
Potassium: 3.7 mmol/L (ref 3.5–5.1)
Sodium: 136 mmol/L (ref 135–145)
TOTAL PROTEIN: 6 g/dL — AB (ref 6.5–8.1)

## 2016-06-26 LAB — PHOSPHORUS: Phosphorus: 3 mg/dL (ref 2.5–4.6)

## 2016-06-26 LAB — MAGNESIUM: MAGNESIUM: 2 mg/dL (ref 1.7–2.4)

## 2016-06-26 MED ORDER — DM-GUAIFENESIN ER 30-600 MG PO TB12
1.0000 | ORAL_TABLET | Freq: Two times a day (BID) | ORAL | Status: DC
  Administered 2016-06-26 – 2016-06-29 (×7): 1 via ORAL
  Filled 2016-06-26 (×8): qty 1

## 2016-06-26 NOTE — Progress Notes (Signed)
PROGRESS NOTE    Cindy Clarke  KGM:[id number] DOB: 05-16-1931 DOA: 06/20/2016 PCP: No primary care provider on file.   Brief Narrative:  Cindy Clarke a 83 y.o.femalewith PMH of Hyperlipidemia and Osteoporosis who presented to the [facility] after a fall. She was stepping off a curb after shopping and isn't sure how, but misstepped and landed on her left side with immediate pain in her left hip. The pain is severe, nonradiating, constant, waxing and waning, worse with any movement, and only moderate when still. She denies any preceding symptoms including palpitations, chest pain, dyspnea or light-headedness. No LOC or head trauma. She was hypertensive on arrival and in severe pain in the left hip with deformity on exam. XR confirmed left femur fracture. Orthopedics consulted and planning operative intervention. TRH called to admit and patient was transferred to [facility] for Surgical Intervention on 06/22/16 and had surgery by Dr. Missy Sabins and is POD 4. PT/OT recommending SNF and awaiting placement. CSW attempting to contact social work at [employer].    Assessment & Plan:   Principal Problem:   Femoral neck fracture (HCC) Active Problems:   Osteoarthritis   Creatinine elevation   HTN (hypertension)   Diastolic dysfunction without heart failure   Leukocytosis  Left Subcapital femoral neck fracture s/p Open Treatment of Femoral Fracture, Proximal End, Neck, Prosthetic Replacement POD4 -In setting of mechanical fall.  -Denied dyspnea or chest pain; ECHO with Normal EF of 60-65%.  -Hip X-Ray showed Acute minimally displaced and impacted left subcapital femoral neck Fracture.  -Patient underwent surgical intervention yesterday by Dr. Missy Sabins and is POD 4 -C/w PT/OT Treatments -Pain management with Hydromorphone 0.5 mg po q2hprn for Severe Pain, Hydrocodone-Acetaminophen 1 tab po q6hprn for Moderate Pain, and Acetaminophen 650 mg po q6hprn for Mild Pain -C/w Robaxin 500 mg  po q6hprn Muscle spasms -C/w with Zofran 4 mg IV q6hprn for Nasuea and with Metoclopramide 5 mg po q8hprn for Nausea if Zofran ineffective -Bowel Regimen with Miralax 17 grams daily and with Senna-Docusate 1 tab po Daily -Ortho Started patient on Enoxaparin 30 mg sq while in the Hospital Per Ortho: Mechanical VTE Prophylaxis with SCD's and TED Thigh-High and Chemical VTE Prophylaxis with BID ASA 81 mg for DVT x 1 month at D/C -Orthopedic recommends FU in 2 weeks for Wound Check; May follow up with [facility] if easier  -Still waiting for placement   Cough -C/w Claritin 10 mg po Daily prn -Check CXR -Mucinex DM 1 tab po BID -Incentive Spirometer   Probably AKI on CKD -Unknown previous baseline.  -Cr on admission at 1.67 and improved to 1.21; Likely now at Baseline (assume is 1.1-1.2) -D/C'd IV Fluids with NS at 50 mL/hr  -Repeat CMP in AM  Hypokalemia -Patient's K+ was 3.3 yesterday and now Improved to 3.7  -Replete with Kcl 40 mEq po BID -Repeat CMP in AM  Mild Leukocytosis -Likely reactive to Pain -WBC went from 11.2 -> 9.5 -> 10.4 -> 10.9 -> 10.3 -No Current S/Sx of Infection -Repeat CBC in AM  HTN -BP may be elevated 2/2 to Pain; Improved -Patient was not taking medications for HTN.  -C/w Amlodipine 10 mg po Daily -Increased Hydralazine 5 mg to 10 mg IV q4hprn for SBP >170 -Control Pain as Above  Hyperlipidemia -Patient no longer Taking Pravastatin -Outpatient Lipid Panel and follow up with PCP  Diastolic Grade 1 Dysfunction -Not Decompensated currently -Strict I's and O's and Daily weights -Continue to monitor Volume Status Closely -D/C'd  IVF now  DVT prophylaxis: Lovenox 30 mg sq q24h Code Status: FULL CODE Family Communication: No Family present at bedside Disposition Plan: SNF when bed is available  Consultants:  - Orthopedics   Procedures:  ECHOCARDIOGRAM Study Conclusions  - Left ventricle: The cavity size was normal. Wall thickness  was   increased in a pattern of mild LVH. Systolic function was normal.   The estimated ejection fraction was in the range of 60% to 65%.   Wall motion was normal; there were no regional wall motion   abnormalities. Doppler parameters are consistent with abnormal   left ventricular relaxation (grade 1 diastolic dysfunction). - Aortic valve: Mildly calcified annulus. - Pulmonary arteries: Systolic pressure was moderately increased.   PA peak pressure: 46 mm Hg (S).   4 Days Post-Op Procedure(s) (LRB): ARTHROPLASTY BIPOLAR HIP (HEMIARTHROPLASTY) (Left)   Antimicrobials: Anti-infectives    Start     Dose/Rate Route Frequency Ordered Stop   06/23/16 0300  ceFAZolin (ANCEF) IVPB 2g/100 mL premix     2 g 200 mL/hr over 30 Minutes Intravenous Every 12 hours 06/22/16 1739 06/23/16 1601   06/22/16 0800  ceFAZolin (ANCEF) IVPB 2g/100 mL premix     2 g 200 mL/hr over 30 Minutes Intravenous To ShortStay Surgical 06/21/16 1447 06/22/16 1524     Subjective: Seen and examined and is hard of hearing. States she started having a cough. Not productive. Hesitant to walk with Physical Therapy today. No other concerns or complaints at this time.   Objective: Vitals:   06/25/16 1633 06/25/16 2113 06/26/16 0654 06/26/16 1120  BP: (!) 149/62 (!) 161/61 (!) 151/59 (!) 145/58  Pulse: 86 84 84 79  Resp: 18 17 17 18   Temp: 98.2 F (36.8 C) 98.4 F (36.9 C) 98.6 F (37 C) 98.4 F (36.9 C)  TempSrc: Oral Oral Oral Oral  SpO2: 100% 100% 94% 96%  Weight:      Height:        Intake/Output Summary (Last 24 hours) at 06/26/16 1228 Last data filed at 06/26/16 0900  Gross per 24 hour  Intake              960 ml  Output              600 ml  Net              360 ml   Filed Weights   06/20/16 1346 06/21/16 1440  Weight: 63.5 kg (140 lb) 65.1 kg (143 lb 8 oz)   Examination: Physical Exam:  Constitutional: Elderly female who is in NAD and calm and comfortable Eyes: Lids and conjunctivae normal,  sclerae anicteric  ENMT: External Ears, Nose appear normal. Hard of hearing.  Neck: Appears normal, supple, no cervical masses, normal ROM, no appreciable thyromegaly, no JVD Respiratory: Clear to auscultation bilaterally, no wheezing, rales, rhonchi or crackles. Normal respiratory effort and patient is not tachypenic. No accessory muscle use. Has a non-productive cough.  Cardiovascular: RRR, no murmurs / rubs / gallops. S1 and S2 auscultated. No extremity edema. Abdomen: Soft, non-tender, non-distended. No masses palpated. No appreciable hepatosplenomegaly. Bowel sounds positive.  GU: Deferred. Musculoskeletal: No clubbing / cyanosis of digits/nails. Skin: No rashes, lesions, ulcers. No induration; Warm and dry. Had skin tear on Left knee. Neurologic: CN 2-12 grossly intact with no focal deficits.Romberg sign cerebellar reflexes not assessed.  Psychiatric: Normal judgment and insight. Alert and oriented x 3. Normal mood and appropriate affect.   Data Reviewed: I have personally  reviewed following labs and imaging studies  CBC:  Recent Labs Lab 06/20/16 1400  06/22/16 0404 06/23/16 0633 06/24/16 0528 06/25/16 0404 06/26/16 0641  WBC 10.2  < > 11.2* 9.5 10.4 10.9* 10.3  NEUTROABS 7.7  --   --  6.3 7.2 7.7 6.3  HGB 14.5  < > 13.2 11.9* 11.1* 10.7* 11.2*  HCT 41.7  < > 39.9 35.1* 32.9* 31.7* 33.4*  MCV 88.3  < > 91.1 90.9 89.6 89.0 90.3  PLT 328  < > 306 245 237 262 290  < > = values in this interval not displayed. Basic Metabolic Panel:  Recent Labs Lab 06/23/16 0633 06/23/16 1551 06/24/16 0528 06/25/16 0404 06/26/16 0641  NA 135 135 137 136 136  K 3.7 3.5 3.3* 3.7 3.7  CL 107 104 106 107 106  CO2 19* 20* 20* 22 22  GLUCOSE 86 152* 108* 109* 96  BUN 17 18 17 20  23*  CREATININE 1.28* 1.26* 1.18* 1.25* 1.21*  CALCIUM 8.4* 8.3* 8.4* 8.4* 8.9  MG 1.8  --  1.8 1.9 2.0  PHOS 2.6  --  2.4* 2.3* 3.0   GFR: Estimated Creatinine Clearance: 28.9 mL/min (by C-G formula based on  SCr of 1.21 mg/dL (H)). Liver Function Tests:  Recent Labs Lab 06/23/16 4540 06/23/16 1551 06/24/16 0528 06/25/16 0404 06/26/16 0641  AST 34 39 34 35 25  ALT 14 16 16 22 17   ALKPHOS 57 61 60 68 69  BILITOT 0.7 0.5 0.9 0.7 0.5  PROT 5.9* 6.0* 5.8* 5.8* 6.0*  ALBUMIN 3.1* 3.0* 2.8* 2.5* 2.6*   No results for input(s): LIPASE, AMYLASE in the last 168 hours. No results for input(s): AMMONIA in the last 168 hours. Coagulation Profile:  Recent Labs Lab 06/20/16 1440  INR 1.01   Cardiac Enzymes: No results for input(s): CKTOTAL, CKMB, CKMBINDEX, TROPONINI in the last 168 hours. BNP (last 3 results) No results for input(s): PROBNP in the last 8760 hours. HbA1C: No results for input(s): HGBA1C in the last 72 hours. CBG: No results for input(s): GLUCAP in the last 168 hours. Lipid Profile: No results for input(s): CHOL, HDL, LDLCALC, TRIG, CHOLHDL, LDLDIRECT in the last 72 hours. Thyroid Function Tests: No results for input(s): TSH, T4TOTAL, FREET4, T3FREE, THYROIDAB in the last 72 hours. Anemia Panel: No results for input(s): VITAMINB12, FOLATE, FERRITIN, TIBC, IRON, RETICCTPCT in the last 72 hours. Sepsis Labs: No results for input(s): PROCALCITON, LATICACIDVEN in the last 168 hours.  Recent Results (from the past 240 hour(s))  Surgical pcr screen     Status: Abnormal   Collection Time: 06/21/16  5:00 AM  Result Value Ref Range Status   MRSA, PCR NEGATIVE NEGATIVE Final   Staphylococcus aureus POSITIVE (A) NEGATIVE Final    Comment:        The Xpert SA Assay (FDA approved for NASAL specimens in patients over 40 years of age), is one component of a comprehensive surveillance program.  Test performance has been validated by [facility] for patients greater than or equal to 39 year old. It is not intended to diagnose infection nor to guide or monitor treatment.     Radiology Studies: Dg Chest Port 1 View  Result Date: 06/26/2016 CLINICAL DATA:  83 year old female  with cough. Post hip replacement. Initial encounter. EXAM: PORTABLE CHEST 1 VIEW COMPARISON:  06/20/2016 and 05/30/2012 chest x-ray. FINDINGS: Mild chronic lung changes including scarring left lung apex and minimal interstitial markings left lung base without evidence of congestive heart failure,  infiltrate or pneumothorax. Nodularity right upper lobe felt to be related to crossing ribs as no abnormality noted in this region on recent exam. Mild cardiomegaly. Calcified mildly tortuous aorta. Prominent bilateral shoulder joint degenerative changes greater on the left. Degenerative changes lower cervical spine. IMPRESSION: No infiltrate or congestive heart failure. Chronic lung changes as noted above. Cardiomegaly. Calcified tortuous aorta. Prominent bilateral shoulder joint degenerative changes with remodeling. Electronically Signed   By: Lacy Duverney M.D.   On: 06/26/2016 11:25   Scheduled Meds: . amLODipine  10 mg Oral Daily  . dextromethorphan-guaiFENesin  1 tablet Oral BID  . enoxaparin (LOVENOX) injection  30 mg Subcutaneous Q24H  . polyethylene glycol  17 g Oral Daily  . senna-docusate  1 tablet Oral Daily   Continuous Infusions: . lactated ringers 10 mL/hr at 06/22/16 1426    LOS: 6 days   Merlene Laughter, DO [facility] Pager [phone]  If 7PM-7AM, please contact night-coverage [email or web address] Password [email or web address] 06/26/2016, 12:28 PM

## 2016-06-26 NOTE — Care Management Important Message (Signed)
Important Message  Patient Details  Name: Cindy Clarke MRN: [id number] Date of Birth: 1930/12/21   Medicare Important Message Given:  Yes    [name] Stefan Church 06/26/2016, 4:53 PM

## 2016-06-26 NOTE — Progress Notes (Signed)
Physical Therapy Treatment Patient Details Name: Cindy Clarke MRN: [id number] DOB: [birth year] Today's Date: 06/26/2016    History of Present Illness 83 y.o. female admitted with L hip fx, s/p bipolar hip hemiarthroplasty. PMH of anxiety, depression, OA.     PT Comments    Patient is making gradual progress toward mobility goals. Pt able to ambulate short distance in room this session with max encouragement to attempt and to increased gait distance. +2 for safety with OOB. Continue to progress as tolerated with anticipated d/c to SNF for further skilled PT services.    Follow Up Recommendations  SNF;Supervision/Assistance - 24 hour     Equipment Recommendations  Rolling walker with 5" wheels    Recommendations for Other Services OT consult     Precautions / Restrictions Precautions Precautions: Fall;Posterior Hip Precaution Comments: reviewed precautions; pt able to recall 3/3  Restrictions Weight Bearing Restrictions: Yes LLE Weight Bearing: Weight bearing as tolerated    Mobility  Bed Mobility Overal bed mobility: Needs Assistance Bed Mobility: Supine to Sit     Supine to sit: Mod assist;+2 for physical assistance     General bed mobility comments: assist to bring bilat LE to EOB, elevate trunk into sitting, and to scoot hips to EOB with use of bed pad  Transfers Overall transfer level: Needs assistance Equipment used: Rolling walker (2 wheeled) Transfers: Sit to/from Stand Sit to Stand: +2 physical assistance;Mod assist         General transfer comment: cues for hand placement and technique with assist to power up into standing; pt tends to pull on  RW to stand despite cues; max mulitmodal cues to attempt upright posture upon standing  Ambulation/Gait Ambulation/Gait assistance: Mod assist;+2 safety/equipment (chair close behind) Ambulation Distance (Feet): 10 Feet Assistive device: Rolling walker (2 wheeled) Gait Pattern/deviations: Step-to  pattern;Decreased stance time - left;Decreased step length - right;Decreased weight shift to left;Decreased dorsiflexion - right;Decreased dorsiflexion - left;Trunk flexed     General Gait Details: cues for sequencing, posture, weight shifting, and safe use of AD; assist to weight shift, maintain upright posture, and manage RW; pt required max encouragment to attempt and increase ambulation   Stairs            Wheelchair Mobility    Modified Rankin (Stroke Patients Only)       Balance                                    Cognition Arousal/Alertness: Awake/alert Behavior During Therapy: WFL for tasks assessed/performed;Anxious Overall Cognitive Status: No family/caregiver present to determine baseline cognitive functioning Area of Impairment: Safety/judgement;Problem solving         Safety/Judgement: Decreased awareness of safety;Decreased awareness of deficits   Problem Solving: Requires verbal cues;Requires tactile cues      Exercises      General Comments        Pertinent Vitals/Pain Pain Assessment: Faces Faces Pain Scale: Hurts even more Pain Location: L hip Pain Descriptors / Indicators: Sore;Grimacing;Guarding;Moaning Pain Intervention(s): Limited activity within patient's tolerance;Monitored during session;Premedicated before session    Home Living                      Prior Function            PT Goals (current goals can now be found in the care plan section) Acute Rehab PT Goals Patient Stated Goal: none stated Progress  towards PT goals: Progressing toward goals    Frequency    Min 3X/week      PT Plan Current plan remains appropriate    Co-evaluation             End of Session Equipment Utilized During Treatment: Gait belt Activity Tolerance: Patient tolerated treatment well Patient left: in chair;with call bell/phone within reach     Time: 1030-1050 PT Time Calculation (min) (ACUTE ONLY): 20  min  Charges:  $Gait Training: 8-22 mins                    G Codes:      Derek Mound, PTA Pager: [phone]   06/26/2016, 1:34 PM

## 2016-06-27 LAB — COMPREHENSIVE METABOLIC PANEL
ALBUMIN: 2.7 g/dL — AB (ref 3.5–5.0)
ALT: 20 U/L (ref 14–54)
AST: 30 U/L (ref 15–41)
Alkaline Phosphatase: 75 U/L (ref 38–126)
Anion gap: 15 (ref 5–15)
BUN: 23 mg/dL — AB (ref 6–20)
CHLORIDE: 99 mmol/L — AB (ref 101–111)
CO2: 23 mmol/L (ref 22–32)
CREATININE: 1.2 mg/dL — AB (ref 0.44–1.00)
Calcium: 9.2 mg/dL (ref 8.9–10.3)
GFR calc Af Amer: 47 mL/min — ABNORMAL LOW (ref >60)
GFR, EST NON AFRICAN AMERICAN: 41 mL/min — AB (ref >60)
GLUCOSE: 91 mg/dL (ref 65–99)
Potassium: 3.6 mmol/L (ref 3.5–5.1)
Sodium: 137 mmol/L (ref 135–145)
Total Bilirubin: 0.8 mg/dL (ref 0.3–1.2)
Total Protein: 6 g/dL — ABNORMAL LOW (ref 6.5–8.1)

## 2016-06-27 LAB — CBC WITH DIFFERENTIAL/PLATELET
BASOS ABS: 0.1 10*3/uL (ref 0.0–0.1)
Basophils Relative: 1 %
EOS PCT: 7 %
Eosinophils Absolute: 0.6 10*3/uL (ref 0.0–0.7)
HEMATOCRIT: 33.9 % — AB (ref 36.0–46.0)
Hemoglobin: 11.2 g/dL — ABNORMAL LOW (ref 12.0–15.0)
LYMPHS PCT: 23 %
Lymphs Abs: 2.1 10*3/uL (ref 0.7–4.0)
MCH: 29.8 pg (ref 26.0–34.0)
MCHC: 33 g/dL (ref 30.0–36.0)
MCV: 90.2 fL (ref 78.0–100.0)
Monocytes Absolute: 1.1 10*3/uL — ABNORMAL HIGH (ref 0.1–1.0)
Monocytes Relative: 13 %
NEUTROS ABS: 5.1 10*3/uL (ref 1.7–7.7)
Neutrophils Relative %: 56 %
PLATELETS: 324 10*3/uL (ref 150–400)
RBC: 3.76 MIL/uL — AB (ref 3.87–5.11)
RDW: 12.7 % (ref 11.5–15.5)
WBC: 9 10*3/uL (ref 4.0–10.5)

## 2016-06-27 LAB — PHOSPHORUS: Phosphorus: 3.9 mg/dL (ref 2.5–4.6)

## 2016-06-27 LAB — MAGNESIUM: MAGNESIUM: 2 mg/dL (ref 1.7–2.4)

## 2016-06-27 MED ORDER — HYDRALAZINE HCL 25 MG PO TABS
25.0000 mg | ORAL_TABLET | Freq: Three times a day (TID) | ORAL | Status: DC
  Administered 2016-06-27 – 2016-06-29 (×5): 25 mg via ORAL
  Filled 2016-06-27 (×5): qty 1

## 2016-06-27 MED ORDER — ASPIRIN EC 81 MG PO TBEC
81.0000 mg | DELAYED_RELEASE_TABLET | Freq: Every day | ORAL | Status: DC
  Administered 2016-06-27 – 2016-06-29 (×3): 81 mg via ORAL
  Filled 2016-06-27 (×3): qty 1

## 2016-06-27 NOTE — Progress Notes (Signed)
PROGRESS NOTE    Cindy Clarke  MAU:[id number] DOB: 1930-12-01 DOA: 06/20/2016 PCP: No primary care provider on file.   Brief Narrative:  Cindy Clarke a 83 y.o.femalewith PMH of Hyperlipidemia and Osteoporosis who presented to the [facility] after a fall. She was stepping off a curb after shopping and isn't sure how, but misstepped and landed on her left side with immediate pain in her left hip. The pain is severe, nonradiating, constant, waxing and waning, worse with any movement, and only moderate when still. She denies any preceding symptoms including palpitations, chest pain, dyspnea or light-headedness. No LOC or head trauma. She was hypertensive on arrival and in severe pain in the left hip with deformity on exam. XR confirmed left femur fracture. Orthopedics consulted and planning operative intervention. TRH called to admit and patient was transferred to [facility] for Surgical Intervention on 06/22/16 and had surgery by Dr. Missy Sabins and is POD 4. PT/OT recommending SNF and awaiting placement as patient and daughter prefer [facility] as it is closer to patient's daughter and home.  Assessment & Plan:   Principal Problem:   Femoral neck fracture (HCC) Active Problems:   Osteoarthritis   Creatinine elevation   HTN (hypertension)   Diastolic dysfunction without heart failure   Leukocytosis  Left Subcapital femoral neck fracture s/p Open Treatment of Femoral Fracture, Proximal End, Neck, Prosthetic Replacement POD5 -In setting of mechanical fall.  -Denied dyspnea or chest pain; ECHO with Normal EF of 60-65%.  -Hip X-Ray showed Acute minimally displaced and impacted left subcapital femoral neck Fracture.  -Patient underwent surgical intervention yesterday by Dr. Missy Sabins and is POD 4 -C/w PT/OT Treatments -Pain management with Hydromorphone 0.5 mg po q2hprn for Severe Pain, Hydrocodone-Acetaminophen 1 tab po q6hprn for Moderate Pain, and Acetaminophen 650 mg po  q6hprn for Mild Pain -C/w Robaxin 500 mg po q6hprn Muscle spasms -C/w with Zofran 4 mg IV q6hprn for Nasuea and with Metoclopramide 5 mg po q8hprn for Nausea if Zofran ineffective -Bowel Regimen with Miralax 17 grams daily and with Senna-Docusate 1 tab po Daily -Ortho Started patient on Enoxaparin 30 mg sq while in the Hospital Per Ortho: Mechanical VTE Prophylaxis with SCD's and TED Thigh-High and Chemical VTE Prophylaxis with BID ASA 81 mg for DVT x 1 month at D/C -Orthopedic recommends FU in 2 weeks for Wound Check; May follow up with [facility] if easier  -Still waiting for placement and Social Worker discussing with Daughter about broadening SNF selection as patient is from [place]. [employer], [facility]. [facility], [facility], [place] [facility], [facility], and [facility] all declined -Per Social work Note: CSW previously explained to daughter and pt that options are now local SNFs or Home with home health. CSW provided [facility] bed offers. CSW made daughter aware that decision must be made on 06/28/16. CSW will continue to follow.   Cough -Patient thinks its from Sinus Drainage -C/w Claritin 10 mg po Daily prn -CXR No infiltrate or congestive heart failure. Chronic lung changes as noted above. Cardiomegaly.  Calcified tortuous aorta. Prominent bilateral shoulder joint degenerative changes with remodeling. -Mucinex DM 1 tab po BID -Incentive Spirometer   Probably AKI on CKD Stage 3 -Unknown previous baseline.  -Cr on admission at 1.67 and improved to 1.21; Likely now at Baseline (assume is 1.1-1.2) -D/C'd IV Fluids with NS at 50 mL/hr  -Repeat CMP in AM  Hypokalemia -Patient's K+  was 3.3 yesterday and now Improved to 3.6  -Replete with Kcl 40 mEq po BID -Repeat CMP in AM  Mild Leukocytosis -Likely reactive to Pain -WBC went from 11.2 -> 9.5 -> 10.4 -> 10.9 -> 10.3 -> 9.0 -No  Current S/Sx of Infection -Repeat CBC in AM  HTN -BP may be elevated 2/2 to Pain; Improved -Patient was not taking medications for HTN.  -C/w Amlodipine 10 mg po Daily -Will Start po Hydralazine 25 mg TID given elevated BP's and Kidney Disease -Increased Hydralazine 5 mg to 10 mg IV q4hprn for SBP >170 -Control Pain as Above  Hyperlipidemia -Patient no longer Taking Pravastatin -Outpatient Lipid Panel and follow up with PCP  Diastolic Grade 1 Dysfunction -Not Decompensated currently -Strict I's and O's and Daily weights -Continue to monitor Volume Status Closely -D/C'd IVF now  DVT prophylaxis: Lovenox 30 mg sq q24h Code Status: FULL CODE Family Communication: No Family present at bedside Disposition Plan: SNF when bed available   Consultants:  - Orthopedics   Procedures:  ECHOCARDIOGRAM Study Conclusions  - Left ventricle: The cavity size was normal. Wall thickness was   increased in a pattern of mild LVH. Systolic function was normal.   The estimated ejection fraction was in the range of 60% to 65%.   Wall motion was normal; there were no regional wall motion   abnormalities. Doppler parameters are consistent with abnormal   left ventricular relaxation (grade 1 diastolic dysfunction). - Aortic valve: Mildly calcified annulus. - Pulmonary arteries: Systolic pressure was moderately increased.   PA peak pressure: 46 mm Hg (S).   4 Days Post-Op Procedure(s) (LRB): ARTHROPLASTY BIPOLAR HIP (HEMIARTHROPLASTY) (Left)   Antimicrobials: Anti-infectives    Start     Dose/Rate Route Frequency Ordered Stop   06/23/16 0300  ceFAZolin (ANCEF) IVPB 2g/100 mL premix     2 g 200 mL/hr over 30 Minutes Intravenous Every 12 hours 06/22/16 1739 06/23/16 1601   06/22/16 0800  ceFAZolin (ANCEF) IVPB 2g/100 mL premix     2 g 200 mL/hr over 30 Minutes Intravenous To [facility] 06/21/16 1447 06/22/16 1524     Subjective: Seen and examined and is hard of hearing. States  she is feeling better but wants to go to SNF. No Nausea or Vomiting. Thinks Cough is from sinus drainage. Tolerating PT well.  Objective: Vitals:   06/26/16 1251 06/26/16 2138 06/27/16 0440 06/27/16 1414  BP: 124/89 (!) 148/52 (!) 159/60 (!) 163/60  Pulse: 82 91 84 91  Resp: 18 17 17 18   Temp: 98 F (36.7 C) 98.2 F (36.8 C) 97.8 F (36.6 C) 97.8 F (36.6 C)  TempSrc: Oral Oral Oral Oral  SpO2: 99% 97% 97% 99%  Weight:      Height:        Intake/Output Summary (Last 24 hours) at 06/27/16 1828 Last data filed at 06/27/16 1814  Gross per 24 hour  Intake             1420 ml  Output             3000 ml  Net            -1580 ml   Filed Weights   06/20/16 1346 06/21/16 1440  Weight: 63.5 kg (140 lb) 65.1 kg (143 lb 8 oz)   Examination: Physical Exam:  Constitutional: Elderly female who is in NAD and calm and comfortable Eyes: Lids and conjunctivae normal, sclerae anicteric  ENMT: External Ears, Nose appear normal. Hard of  hearing.  Neck: Appears normal, supple, no cervical masses, normal ROM, no appreciable thyromegaly, no JVD Respiratory: Clear to auscultation bilaterally, no wheezing, rales, rhonchi or crackles. Normal respiratory effort and patient is not tachypenic. No accessory muscle use. Has a non-productive cough.  Cardiovascular: RRR, no murmurs / rubs / gallops. S1 and S2 auscultated. No extremity edema. Abdomen: Soft, non-tender, non-distended. No masses palpated. No appreciable hepatosplenomegaly. Bowel sounds positive.  GU: Deferred. Musculoskeletal: No clubbing / cyanosis of digits/nails. Skin: No rashes, lesions, ulcers. No induration; Warm and dry. Had skin tear on Left knee. Neurologic: CN 2-12 grossly intact with no focal deficits.Romberg sign cerebellar reflexes not assessed.  Psychiatric: Normal judgment and insight. Alert and oriented x 3. Normal mood and appropriate affect.   Data Reviewed: I have personally reviewed following labs and imaging  studies  CBC:  Recent Labs Lab 06/23/16 0633 06/24/16 0528 06/25/16 0404 06/26/16 0641 06/27/16 0728  WBC 9.5 10.4 10.9* 10.3 9.0  NEUTROABS 6.3 7.2 7.7 6.3 5.1  HGB 11.9* 11.1* 10.7* 11.2* 11.2*  HCT 35.1* 32.9* 31.7* 33.4* 33.9*  MCV 90.9 89.6 89.0 90.3 90.2  PLT 245 237 262 290 324   Basic Metabolic Panel:  Recent Labs Lab 06/23/16 0633 06/23/16 1551 06/24/16 0528 06/25/16 0404 06/26/16 0641 06/27/16 0728  NA 135 135 137 136 136 137  K 3.7 3.5 3.3* 3.7 3.7 3.6  CL 107 104 106 107 106 99*  CO2 19* 20* 20* 22 22 23   GLUCOSE 86 152* 108* 109* 96 91  BUN 17 18 17 20  23* 23*  CREATININE 1.28* 1.26* 1.18* 1.25* 1.21* 1.20*  CALCIUM 8.4* 8.3* 8.4* 8.4* 8.9 9.2  MG 1.8  --  1.8 1.9 2.0 2.0  PHOS 2.6  --  2.4* 2.3* 3.0 3.9   GFR: Estimated Creatinine Clearance: 29.2 mL/min (by C-G formula based on SCr of 1.2 mg/dL (H)). Liver Function Tests:  Recent Labs Lab 06/23/16 1551 06/24/16 0528 06/25/16 0404 06/26/16 0641 06/27/16 0728  AST 39 34 35 25 30  ALT 16 16 22 17 20   ALKPHOS 61 60 68 69 75  BILITOT 0.5 0.9 0.7 0.5 0.8  PROT 6.0* 5.8* 5.8* 6.0* 6.0*  ALBUMIN 3.0* 2.8* 2.5* 2.6* 2.7*   No results for input(s): LIPASE, AMYLASE in the last 168 hours. No results for input(s): AMMONIA in the last 168 hours. Coagulation Profile: No results for input(s): INR, PROTIME in the last 168 hours. Cardiac Enzymes: No results for input(s): CKTOTAL, CKMB, CKMBINDEX, TROPONINI in the last 168 hours. BNP (last 3 results) No results for input(s): PROBNP in the last 8760 hours. HbA1C: No results for input(s): HGBA1C in the last 72 hours. CBG: No results for input(s): GLUCAP in the last 168 hours. Lipid Profile: No results for input(s): CHOL, HDL, LDLCALC, TRIG, CHOLHDL, LDLDIRECT in the last 72 hours. Thyroid Function Tests: No results for input(s): TSH, T4TOTAL, FREET4, T3FREE, THYROIDAB in the last 72 hours. Anemia Panel: No results for input(s): VITAMINB12, FOLATE,  FERRITIN, TIBC, IRON, RETICCTPCT in the last 72 hours. Sepsis Labs: No results for input(s): PROCALCITON, LATICACIDVEN in the last 168 hours.  Recent Results (from the past 240 hour(s))  Surgical pcr screen     Status: Abnormal   Collection Time: 06/21/16  5:00 AM  Result Value Ref Range Status   MRSA, PCR NEGATIVE NEGATIVE Final   Staphylococcus aureus POSITIVE (A) NEGATIVE Final    Comment:        The Xpert SA Assay (FDA approved for NASAL  specimens in patients over 32 years of age), is one component of a comprehensive surveillance program.  Test performance has been validated by [facility] for patients greater than or equal to 36 year old. It is not intended to diagnose infection nor to guide or monitor treatment.     Radiology Studies: Dg Chest Port 1 View  Result Date: 06/26/2016 CLINICAL DATA:  83 year old female with cough. Post hip replacement. Initial encounter. EXAM: PORTABLE CHEST 1 VIEW COMPARISON:  06/20/2016 and 05/30/2012 chest x-ray. FINDINGS: Mild chronic lung changes including scarring left lung apex and minimal interstitial markings left lung base without evidence of congestive heart failure, infiltrate or pneumothorax. Nodularity right upper lobe felt to be related to crossing ribs as no abnormality noted in this region on recent exam. Mild cardiomegaly. Calcified mildly tortuous aorta. Prominent bilateral shoulder joint degenerative changes greater on the left. Degenerative changes lower cervical spine. IMPRESSION: No infiltrate or congestive heart failure. Chronic lung changes as noted above. Cardiomegaly. Calcified tortuous aorta. Prominent bilateral shoulder joint degenerative changes with remodeling. Electronically Signed   By: Lacy Duverney M.D.   On: 06/26/2016 11:25   Scheduled Meds: . amLODipine  10 mg Oral Daily  . aspirin EC  81 mg Oral Daily  . dextromethorphan-guaiFENesin  1 tablet Oral BID  . enoxaparin (LOVENOX) injection  30 mg Subcutaneous Q24H   . hydrALAZINE  25 mg Oral Q8H  . polyethylene glycol  17 g Oral Daily  . senna-docusate  1 tablet Oral Daily   Continuous Infusions: . lactated ringers 10 mL/hr at 06/22/16 1426    LOS: 7 days   Merlene Laughter, DO [facility] Pager [phone]  If 7PM-7AM, please contact night-coverage [email or web address] Password [facility] 06/27/2016, 6:28 PM

## 2016-06-27 NOTE — Clinical Social Work Note (Signed)
CSW spoke with daughter, Cindy Clarke [phone]) about bed choices for pt. Pt and daughter preferred [facility] facilities as this is closer to pt home and daughter. [place], [place]. [facility], [facility], [place] [facility], [facility], and [facility] all declined. CSW will update daughter on 06/28/16. CSW previously explained to daughter and pt that options are now local SNFs or Home with home health. CSW provided [facility] bed offers. CSW made daughter aware that decision must be made on 06/28/16. CSW will continue to follow.

## 2016-06-28 DIAGNOSIS — S72002A Fracture of unspecified part of neck of left femur, initial encounter for closed fracture: Secondary | ICD-10-CM

## 2016-06-28 DIAGNOSIS — I10 Essential (primary) hypertension: Secondary | ICD-10-CM

## 2016-06-28 DIAGNOSIS — Z79891 Long term (current) use of opiate analgesic: Secondary | ICD-10-CM

## 2016-06-28 DIAGNOSIS — Z888 Allergy status to other drugs, medicaments and biological substances status: Secondary | ICD-10-CM

## 2016-06-28 DIAGNOSIS — Z9889 Other specified postprocedural states: Secondary | ICD-10-CM

## 2016-06-28 DIAGNOSIS — W19XXXA Unspecified fall, initial encounter: Secondary | ICD-10-CM

## 2016-06-28 DIAGNOSIS — S72009A Fracture of unspecified part of neck of unspecified femur, initial encounter for closed fracture: Secondary | ICD-10-CM

## 2016-06-28 DIAGNOSIS — F4320 Adjustment disorder, unspecified: Secondary | ICD-10-CM

## 2016-06-28 DIAGNOSIS — Z79899 Other long term (current) drug therapy: Secondary | ICD-10-CM

## 2016-06-28 DIAGNOSIS — I519 Heart disease, unspecified: Secondary | ICD-10-CM

## 2016-06-28 DIAGNOSIS — Z90710 Acquired absence of both cervix and uterus: Secondary | ICD-10-CM

## 2016-06-28 LAB — CBC WITH DIFFERENTIAL/PLATELET
Basophils Absolute: 0.1 K/uL (ref 0.0–0.1)
Basophils Relative: 1 %
Eosinophils Absolute: 0.5 K/uL (ref 0.0–0.7)
Eosinophils Relative: 6 %
HCT: 33.8 % — ABNORMAL LOW (ref 36.0–46.0)
Hemoglobin: 11.3 g/dL — ABNORMAL LOW (ref 12.0–15.0)
Lymphocytes Relative: 24 %
Lymphs Abs: 2.2 K/uL (ref 0.7–4.0)
MCH: 30.5 pg (ref 26.0–34.0)
MCHC: 33.4 g/dL (ref 30.0–36.0)
MCV: 91.4 fL (ref 78.0–100.0)
Monocytes Absolute: 1.4 K/uL — ABNORMAL HIGH (ref 0.1–1.0)
Monocytes Relative: 16 %
Neutro Abs: 4.8 K/uL (ref 1.7–7.7)
Neutrophils Relative %: 53 %
Platelets: 364 K/uL (ref 150–400)
RBC: 3.7 MIL/uL — ABNORMAL LOW (ref 3.87–5.11)
RDW: 13 % (ref 11.5–15.5)
WBC: 9 K/uL (ref 4.0–10.5)

## 2016-06-28 LAB — COMPREHENSIVE METABOLIC PANEL
ALK PHOS: 77 U/L (ref 38–126)
ALT: 25 U/L (ref 14–54)
ANION GAP: 11 (ref 5–15)
AST: 37 U/L (ref 15–41)
Albumin: 2.6 g/dL — ABNORMAL LOW (ref 3.5–5.0)
BUN: 27 mg/dL — ABNORMAL HIGH (ref 6–20)
CALCIUM: 9.2 mg/dL (ref 8.9–10.3)
CO2: 26 mmol/L (ref 22–32)
Chloride: 99 mmol/L — ABNORMAL LOW (ref 101–111)
Creatinine, Ser: 1.41 mg/dL — ABNORMAL HIGH (ref 0.44–1.00)
GFR calc non Af Amer: 33 mL/min — ABNORMAL LOW (ref >60)
GFR, EST AFRICAN AMERICAN: 39 mL/min — AB (ref >60)
Glucose, Bld: 100 mg/dL — ABNORMAL HIGH (ref 65–99)
POTASSIUM: 4 mmol/L (ref 3.5–5.1)
SODIUM: 136 mmol/L (ref 135–145)
Total Bilirubin: 0.5 mg/dL (ref 0.3–1.2)
Total Protein: 6 g/dL — ABNORMAL LOW (ref 6.5–8.1)

## 2016-06-28 LAB — PHOSPHORUS: Phosphorus: 4.5 mg/dL (ref 2.5–4.6)

## 2016-06-28 LAB — MAGNESIUM: Magnesium: 2.2 mg/dL (ref 1.7–2.4)

## 2016-06-28 MED ORDER — HYDROMORPHONE HCL 2 MG/ML IJ SOLN
0.5000 mg | INTRAMUSCULAR | Status: DC | PRN
  Administered 2016-06-28 – 2016-06-29 (×3): 0.5 mg via INTRAVENOUS
  Filled 2016-06-28 (×3): qty 1

## 2016-06-28 MED ORDER — ACETAMINOPHEN 500 MG PO TABS
1000.0000 mg | ORAL_TABLET | Freq: Three times a day (TID) | ORAL | Status: DC
  Administered 2016-06-28 – 2016-06-29 (×3): 1000 mg via ORAL
  Filled 2016-06-28 (×3): qty 2

## 2016-06-28 MED ORDER — OXYCODONE HCL 5 MG PO TABS
5.0000 mg | ORAL_TABLET | Freq: Four times a day (QID) | ORAL | Status: DC | PRN
  Administered 2016-06-28 – 2016-06-29 (×4): 10 mg via ORAL
  Filled 2016-06-28 (×4): qty 2

## 2016-06-28 NOTE — Progress Notes (Addendum)
PROGRESS NOTE        PATIENT DETAILS Name: Cindy Clarke Age: 83 y.o. Sex: female Date of Birth: Oct 12, 1930 Admit Date: 06/20/2016 Admitting Physician Tyrone Nine, MD PCP:No primary care provider on file.  Brief Narrative: Patient is a 83 y.o. female with past medical history of hypertension, chronic diastolic heart failure admitted on 1/30 following a mechanical fall, found to have acute left subcapital femoral neck fracture. Underwent open treatment of femoral fracture on 2/1. Postoperative course has been relatively uncomplicated, awaiting SNF placement. See below for further details  Subjective: Angry, agitated this morning-no SNF bed available in [place]-does not want to go to SNF here in [place]. She appears to be paranoid-blaming "certain people". Thinks she will be going to her daughter's place with home health.  Assessment/Plan: Principal Problem: Femoral neck fracture: Following a mechanical fall, underwent surgical intervention on 2/1. Postoperative course was relatively uncomplicated. Recommendations from Orthopedics are to continue with Mechanical VTE Prophylaxis with SCD's and TED Thigh-High and Chemical VTE Prophylaxis with BID ASA 81 mg for DVT x 1 month at D/C.  Chronic kidney disease stage III: Creatinine close to usual baseline. Follow periodically.  Hypertension: Controlled, continue amlodipine, hydralazine  Chronic diastolic heart failure: Clinically compensated, follow volume status.  Paranoia: Very paranoid today-blaming "certain people" for her not getting a SNF bed in [place] area. Very agitated and belligerent about having to go to a SNF bed here in [place]. Thinking about going to her daughter's house-subsequently spoke to her daughter over the phone-apparently patient has had similar behaviors in the past-and was committed a few years ago. Since patient refusing SNF, and wants to go home-have asked psych to evaluate for  capacity.  DVT Prophylaxis: Prophylactic Lovenox   Code Status: Full code   Family Communication: Daughter over the phone  Disposition Plan: Remain inpatient-suspect SNF on discharge on 2/8  Antimicrobial agents: Anti-infectives    Start     Dose/Rate Route Frequency Ordered Stop   06/23/16 0300  ceFAZolin (ANCEF) IVPB 2g/100 mL premix     2 g 200 mL/hr over 30 Minutes Intravenous Every 12 hours 06/22/16 1739 06/23/16 1601   06/22/16 0800  ceFAZolin (ANCEF) IVPB 2g/100 mL premix     2 g 200 mL/hr over 30 Minutes Intravenous To [facility] 06/21/16 1447 06/22/16 1524      Procedures: Echo 1/31>>  Study Conclusions - Left ventricle: The cavity size was normal. Wall thickness was   increased in a pattern of mild LVH. Systolic function was normal.   The estimated ejection fraction was in the range of 60% to 65%.   Wall motion was normal; there were no regional wall motion   abnormalities. Doppler parameters are consistent with abnormal   left ventricular relaxation (grade 1 diastolic dysfunction). - Aortic valve: Mildly calcified annulus. - Pulmonary arteries: Systolic pressure was moderately increased.   PA peak pressure: 46 mm Hg (S).  Open treatment of femoral fracture, proximal end, neck, prosthetic replacement CPT 27236 2/1>>  CONSULTS:  orthopedic surgery  Time spent: 25 minutes-Greater than 50% of this time was spent in counseling, explanation of diagnosis, planning of further management, and coordination of care.  MEDICATIONS: Scheduled Meds: . amLODipine  10 mg Oral Daily  . aspirin EC  81 mg Oral Daily  . dextromethorphan-guaiFENesin  1 tablet Oral BID  . enoxaparin (LOVENOX) injection  30 mg Subcutaneous Q24H  . hydrALAZINE  25 mg Oral Q8H  . polyethylene glycol  17 g Oral Daily  . senna-docusate  1 tablet Oral Daily   Continuous Infusions: . lactated ringers 10 mL/hr at 06/22/16 1426   PRN Meds:.acetaminophen **OR** acetaminophen,  hydrALAZINE, HYDROcodone-acetaminophen, HYDROmorphone (DILAUDID) injection, loratadine, methocarbamol **OR** methocarbamol (ROBAXIN)  IV, metoCLOPramide **OR** metoCLOPramide (REGLAN) injection, ondansetron (ZOFRAN) IV, ondansetron **OR** ondansetron (ZOFRAN) IV   PHYSICAL EXAM: Vital signs: Vitals:   06/27/16 1414 06/27/16 2024 06/28/16 0624 06/28/16 1251  BP: (!) 163/60 (!) 146/68 126/78 (!) 136/54  Pulse: 91 97 80 85  Resp: 18 18 18    Temp: 97.8 F (36.6 C) 98.3 F (36.8 C) 97.8 F (36.6 C) 98.1 F (36.7 C)  TempSrc: Oral Oral Oral Oral  SpO2: 99% 96% 98% 96%  Weight:      Height:       Filed Weights   06/20/16 1346 06/21/16 1440  Weight: 63.5 kg (140 lb) 65.1 kg (143 lb 8 oz)   Body mass index is 28.98 kg/m.   General appearance :Awake, alert, not in any distress. Agitated, tearful at times Eyes:, pupils equally reactive to light and accomodation,no scleral icterus. HEENT: Atraumatic and Normocephalic Neck: supple, no JVD. No cervical lymphadenopathy. No thyromegaly Resp:Good air entry bilaterally, no added sounds  CVS: S1 S2 regular, no murmurs.  GI: Bowel sounds present, Non tender and not distended with no gaurding, rigidity or rebound.No organomegaly Extremities: B/L Lower Ext shows no edema, both legs are warm to touch Neurology:  speech clear,Non focal, sensation is grossly intact. Psychiatric: Agitated, tearful at times Musculoskeletal:No digital cyanosis Skin:No Rash, warm and dry Wounds:N/A  I have personally reviewed following labs and imaging studies  LABORATORY DATA: CBC:  Recent Labs Lab 06/24/16 0528 06/25/16 0404 06/26/16 0641 06/27/16 0728 06/28/16 0419  WBC 10.4 10.9* 10.3 9.0 9.0  NEUTROABS 7.2 7.7 6.3 5.1 4.8  HGB 11.1* 10.7* 11.2* 11.2* 11.3*  HCT 32.9* 31.7* 33.4* 33.9* 33.8*  MCV 89.6 89.0 90.3 90.2 91.4  PLT 237 262 290 324 364    Basic Metabolic Panel:  Recent Labs Lab 06/24/16 0528 06/25/16 0404 06/26/16 0641  06/27/16 0728 06/28/16 0419  NA 137 136 136 137 136  K 3.3* 3.7 3.7 3.6 4.0  CL 106 107 106 99* 99*  CO2 20* 22 22 23 26   GLUCOSE 108* 109* 96 91 100*  BUN 17 20 23* 23* 27*  CREATININE 1.18* 1.25* 1.21* 1.20* 1.41*  CALCIUM 8.4* 8.4* 8.9 9.2 9.2  MG 1.8 1.9 2.0 2.0 2.2  PHOS 2.4* 2.3* 3.0 3.9 4.5    GFR: Estimated Creatinine Clearance: 24.8 mL/min (by C-G formula based on SCr of 1.41 mg/dL (H)).  Liver Function Tests:  Recent Labs Lab 06/24/16 0528 06/25/16 0404 06/26/16 0641 06/27/16 0728 06/28/16 0419  AST 34 35 25 30 37  ALT 16 22 17 20 25   ALKPHOS 60 68 69 75 77  BILITOT 0.9 0.7 0.5 0.8 0.5  PROT 5.8* 5.8* 6.0* 6.0* 6.0*  ALBUMIN 2.8* 2.5* 2.6* 2.7* 2.6*   No results for input(s): LIPASE, AMYLASE in the last 168 hours. No results for input(s): AMMONIA in the last 168 hours.  Coagulation Profile: No results for input(s): INR, PROTIME in the last 168 hours.  Cardiac Enzymes: No results for input(s): CKTOTAL, CKMB, CKMBINDEX, TROPONINI in the last 168 hours.  BNP (last 3 results) No results for input(s): PROBNP in the last 8760 hours.  HbA1C: No results for  input(s): HGBA1C in the last 72 hours.  CBG: No results for input(s): GLUCAP in the last 168 hours.  Lipid Profile: No results for input(s): CHOL, HDL, LDLCALC, TRIG, CHOLHDL, LDLDIRECT in the last 72 hours.  Thyroid Function Tests: No results for input(s): TSH, T4TOTAL, FREET4, T3FREE, THYROIDAB in the last 72 hours.  Anemia Panel: No results for input(s): VITAMINB12, FOLATE, FERRITIN, TIBC, IRON, RETICCTPCT in the last 72 hours.  Urine analysis:    Component Value Date/Time   COLORURINE LT. YELLOW 03/17/2013 1337   APPEARANCEUR CLEAR 03/17/2013 1337   LABSPEC 1.025 03/17/2013 1337   PHURINE 6.0 03/17/2013 1337   GLUCOSEU NEGATIVE 03/17/2013 1337   HGBUR NEGATIVE 03/17/2013 1337   BILIRUBINUR NEGATIVE 03/17/2013 1337   KETONESUR NEGATIVE 03/17/2013 1337   PROTEINUR 30 (A) 10/05/2010 1038    UROBILINOGEN 0.2 03/17/2013 1337   NITRITE NEGATIVE 03/17/2013 1337   LEUKOCYTESUR NEGATIVE 03/17/2013 1337    Sepsis Labs: Lactic Acid, Venous No results found for: LATICACIDVEN  MICROBIOLOGY: Recent Results (from the past 240 hour(s))  Surgical pcr screen     Status: Abnormal   Collection Time: 06/21/16  5:00 AM  Result Value Ref Range Status   MRSA, PCR NEGATIVE NEGATIVE Final   Staphylococcus aureus POSITIVE (A) NEGATIVE Final    Comment:        The Xpert SA Assay (FDA approved for NASAL specimens in patients over 24 years of age), is one component of a comprehensive surveillance program.  Test performance has been validated by [facility] for patients greater than or equal to 97 year old. It is not intended to diagnose infection nor to guide or monitor treatment.     RADIOLOGY STUDIES/RESULTS: Dg Chest 1 View  Result Date: 06/20/2016 CLINICAL DATA:  Acute left hip fracture EXAM: CHEST 1 VIEW COMPARISON:  05/30/2012 FINDINGS: Mild cardiomegaly with central vascular congestion. No focal pneumonia, collapse or consolidation. Negative for edema, effusion or pneumothorax. Trachea is midline. Atherosclerosis noted of the aorta. Degenerative changes noted spine. Advanced arthropathy of both shoulders with humeral head deformities. IMPRESSION: Cardiomegaly with vascular congestion. Negative for CHF or pneumonia Thoracic aortic atherosclerosis Electronically Signed   By: Judie Petit.  Shick M.D.   On: 06/20/2016 14:57   Dg Pelvis Portable  Result Date: 06/22/2016 CLINICAL DATA:  Hip fracture. EXAM: PORTABLE PELVIS 1-2 VIEWS COMPARISON:  Pelvis radiograph 10/05/2010 FINDINGS: There has been a 2 component left hip arthroplasty. The prosthetic femoral head is normally aligned with the acetabulum. No displaced fractures are seen. Hyperdense granules overlie the left thigh. Osteoarthritic changes of the right hip joint are noted. IMPRESSION: Post 2 component left hip arthroplasty without  evidence of hardware complications. Electronically Signed   By: Ted Mcalpine M.D.   On: 06/22/2016 16:58   Dg Chest Port 1 View  Result Date: 06/26/2016 CLINICAL DATA:  83 year old female with cough. Post hip replacement. Initial encounter. EXAM: PORTABLE CHEST 1 VIEW COMPARISON:  06/20/2016 and 05/30/2012 chest x-ray. FINDINGS: Mild chronic lung changes including scarring left lung apex and minimal interstitial markings left lung base without evidence of congestive heart failure, infiltrate or pneumothorax. Nodularity right upper lobe felt to be related to crossing ribs as no abnormality noted in this region on recent exam. Mild cardiomegaly. Calcified mildly tortuous aorta. Prominent bilateral shoulder joint degenerative changes greater on the left. Degenerative changes lower cervical spine. IMPRESSION: No infiltrate or congestive heart failure. Chronic lung changes as noted above. Cardiomegaly. Calcified tortuous aorta. Prominent bilateral shoulder joint degenerative changes with remodeling. Electronically  Signed   By: Lacy Duverney M.D.   On: 06/26/2016 11:25   Dg Hip Unilat With Pelvis 2-3 Views Left  Result Date: 06/20/2016 CLINICAL DATA:  Recent fall, left hip injury, acute pain EXAM: DG HIP (WITH OR WITHOUT PELVIS) 2-3V LEFT COMPARISON:  None available FINDINGS: There is an acute left hip subcapital femoral neck fracture with minimal displacement and impaction. No associated hip subluxation or dislocation. Bony pelvis appears intact. No displaced fracture. Bowel artifact overlies the rami. No diastases. Degenerative changes of the lower lumbar spine and SI joints. Right hip appears unremarkable. IMPRESSION: Acute minimally displaced and impacted left subcapital femoral neck fracture. Electronically Signed   By: Judie Petit.  Shick M.D.   On: 06/20/2016 14:50     LOS: 8 days   Jeoffrey Massed, MD  [facility] Pager:[phone]  If 7PM-7AM, please contact  night-coverage [email or web address] Password [facility] 06/28/2016, 3:48 PM

## 2016-06-28 NOTE — Consult Note (Signed)
[place] Psychiatry Consult   Reason for Consult:  Capacity evaluation Referring Physician:  Dr. Sloan Leiter Patient Identification: Cindy Clarke MRN:  [id number] Principal Diagnosis: Femoral neck fracture [facility]) Diagnosis:   Patient Active Problem List   Diagnosis Date Noted  . Diastolic dysfunction without heart failure [I51.9] 06/22/2016  . Leukocytosis [D72.829] 06/22/2016  . Femoral neck fracture ([place]) [S72.009A] 06/20/2016  . Creatinine elevation [R79.89] 06/20/2016  . HTN (hypertension) [I10] 06/20/2016  . Other abnormal glucose [R73.09] 03/18/2013  . Need for prophylactic vaccination against Streptococcus pneumoniae (pneumococcus) [Z23] 03/18/2013  . Routine general medical examination at a health care facility [Z00.00] 03/18/2013  . Other screening mammogram [Z12.31] 03/17/2013  . Hard of hearing [H91.90] 05/29/2012  . Thoracic vertebral fracture ([place]) [S22.009A] 10/26/2010  . Insomnia [G47.00] 09/15/2010  . Allergic rhinitis due to other allergen [J30.89] 04/21/2010  . Hyperlipidemia [E78.5] 12/30/2009  . Osteoarthritis [M19.90] 12/30/2009  . OSTEOPOROSIS [M81.0] 12/30/2009    Total Time spent with patient: 1 hour  Subjective:   Cindy Clarke is a 83 y.o. female patient admitted with fall and injury to her hip.  HPI:  Cindy Clarke is a 83 y.o. female, seen, chart reviewed and case discussed with the staff RN for face-to-face psychiatric consultation and evaluation of capacity to make her own medical decisions and living arrangements. Patient reported she has been suffering with multiple medical problems including osteoarthritis, osteoporosis, hard of hearing bilaterally, hypertension and femoral neck fracture. Patient reported that she wants to go and stay with her family in [place] and also willing to participate in physical rehabilitation if available. Patient made up her mind she does not want to stay in [place] or does not want to go to nursing home which  she feels negative about the placement. Patient denied symptoms of depression, anxiety, irritability, agitation, auditory/visual hallucinations, delusions and paranoia. Patient has no history of psychiatric inpatient or outpatient treatment. Patient reported she retired from working for [employer] engineer for 35 years. Patient husband passed away with excessive drinking and complications. Patient has been supported by other family members patient has intact cognitions including orientation, concentration, memory and language functions..     Past Psychiatric History: Patient denied history of mental illness and treatment needs.   Risk to Self: Is patient at risk for suicide?: No Risk to Others:   Prior Inpatient Therapy:   Prior Outpatient Therapy:    Past Medical History:  Past Medical History:  Diagnosis Date  . Anxiety and depression 10/12/2010  . Hyperlipidemia   . Osteoarthritis   . Osteoporosis     Past Surgical History:  Procedure Laterality Date  . ABDOMINAL HYSTERECTOMY    . HIP ARTHROPLASTY Left 06/22/2016   Procedure: ARTHROPLASTY BIPOLAR HIP (HEMIARTHROPLASTY);  Surgeon: Nicholes Stairs, MD;  Location: [facility];  Service: Orthopedics;  Laterality: Left;   Family History:  Family History  Problem Relation Age of Onset  . Arthritis Other   . Hyperlipidemia Other    Family Psychiatric  History: No family history of mental illness.cial History:  History  Alcohol Use No     History  Drug Use No    Social History   Social History  . Marital status: Single    Spouse name: N/A  . Number of children: N/A  . Years of education: N/A   Social History Main Topics  . Smoking status: Never Smoker  . Smokeless tobacco: Never Used     Comment: Regular Exercise-No  . Alcohol use No  .  Drug use: No  . Sexual activity: Not Currently   Other Topics Concern  . None   Social History Narrative  . None   Additional Social History:    Allergies:    Allergies  Allergen Reactions  . Influenza Vaccines Nausea And Vomiting  . Niacin Palpitations    Labs:  Results for orders placed or performed during the hospital encounter of 06/20/16 (from the past 48 hour(s))  CBC with Differential/Platelet     Status: Abnormal   Collection Time: 06/27/16  7:28 AM  Result Value Ref Range   WBC 9.0 4.0 - 10.5 K/uL   RBC 3.76 (L) 3.87 - 5.11 MIL/uL   Hemoglobin 11.2 (L) 12.0 - 15.0 g/dL   HCT 33.9 (L) 36.0 - 46.0 %   MCV 90.2 78.0 - 100.0 fL   MCH 29.8 26.0 - 34.0 pg   MCHC 33.0 30.0 - 36.0 g/dL   RDW 12.7 11.5 - 15.5 %   Platelets 324 150 - 400 K/uL   Neutrophils Relative % 56 %   Neutro Abs 5.1 1.7 - 7.7 K/uL   Lymphocytes Relative 23 %   Lymphs Abs 2.1 0.7 - 4.0 K/uL   Monocytes Relative 13 %   Monocytes Absolute 1.1 (H) 0.1 - 1.0 K/uL   Eosinophils Relative 7 %   Eosinophils Absolute 0.6 0.0 - 0.7 K/uL   Basophils Relative 1 %   Basophils Absolute 0.1 0.0 - 0.1 K/uL  Comprehensive metabolic panel     Status: Abnormal   Collection Time: 06/27/16  7:28 AM  Result Value Ref Range   Sodium 137 135 - 145 mmol/L   Potassium 3.6 3.5 - 5.1 mmol/L   Chloride 99 (L) 101 - 111 mmol/L   CO2 23 22 - 32 mmol/L   Glucose, Bld 91 65 - 99 mg/dL   BUN 23 (H) 6 - 20 mg/dL   Creatinine, Ser 1.20 (H) 0.44 - 1.00 mg/dL   Calcium 9.2 8.9 - 10.3 mg/dL   Total Protein 6.0 (L) 6.5 - 8.1 g/dL   Albumin 2.7 (L) 3.5 - 5.0 g/dL   AST 30 15 - 41 U/L   ALT 20 14 - 54 U/L   Alkaline Phosphatase 75 38 - 126 U/L   Total Bilirubin 0.8 0.3 - 1.2 mg/dL   GFR calc non Af Amer 41 (L) >60 mL/min   GFR calc Af Amer 47 (L) >60 mL/min    Comment: (NOTE) The eGFR has been calculated using the CKD EPI equation. This calculation has not been validated in all clinical situations. eGFR's persistently <60 mL/min signify possible Chronic Kidney Disease.    Anion gap 15 5 - 15  Magnesium     Status: None   Collection Time: 06/27/16  7:28 AM  Result Value Ref Range    Magnesium 2.0 1.7 - 2.4 mg/dL  Phosphorus     Status: None   Collection Time: 06/27/16  7:28 AM  Result Value Ref Range   Phosphorus 3.9 2.5 - 4.6 mg/dL  CBC with Differential/Platelet     Status: Abnormal   Collection Time: 06/28/16  4:19 AM  Result Value Ref Range   WBC 9.0 4.0 - 10.5 K/uL    Comment: WHITE COUNT CONFIRMED ON SMEAR   RBC 3.70 (L) 3.87 - 5.11 MIL/uL   Hemoglobin 11.3 (L) 12.0 - 15.0 g/dL   HCT 33.8 (L) 36.0 - 46.0 %   MCV 91.4 78.0 - 100.0 fL   MCH 30.5 26.0 - 34.0 pg  MCHC 33.4 30.0 - 36.0 g/dL   RDW 13.0 11.5 - 15.5 %   Platelets 364 150 - 400 K/uL   Neutrophils Relative % 53 %   Lymphocytes Relative 24 %   Monocytes Relative 16 %   Eosinophils Relative 6 %   Basophils Relative 1 %   Neutro Abs 4.8 1.7 - 7.7 K/uL   Lymphs Abs 2.2 0.7 - 4.0 K/uL   Monocytes Absolute 1.4 (H) 0.1 - 1.0 K/uL   Eosinophils Absolute 0.5 0.0 - 0.7 K/uL   Basophils Absolute 0.1 0.0 - 0.1 K/uL   Smear Review MORPHOLOGY UNREMARKABLE   Comprehensive metabolic panel     Status: Abnormal   Collection Time: 06/28/16  4:19 AM  Result Value Ref Range   Sodium 136 135 - 145 mmol/L   Potassium 4.0 3.5 - 5.1 mmol/L   Chloride 99 (L) 101 - 111 mmol/L   CO2 26 22 - 32 mmol/L   Glucose, Bld 100 (H) 65 - 99 mg/dL   BUN 27 (H) 6 - 20 mg/dL   Creatinine, Ser 1.41 (H) 0.44 - 1.00 mg/dL   Calcium 9.2 8.9 - 10.3 mg/dL   Total Protein 6.0 (L) 6.5 - 8.1 g/dL   Albumin 2.6 (L) 3.5 - 5.0 g/dL   AST 37 15 - 41 U/L   ALT 25 14 - 54 U/L   Alkaline Phosphatase 77 38 - 126 U/L   Total Bilirubin 0.5 0.3 - 1.2 mg/dL   GFR calc non Af Amer 33 (L) >60 mL/min   GFR calc Af Amer 39 (L) >60 mL/min    Comment: (NOTE) The eGFR has been calculated using the CKD EPI equation. This calculation has not been validated in all clinical situations. eGFR's persistently <60 mL/min signify possible Chronic Kidney Disease.    Anion gap 11 5 - 15  Magnesium     Status: None   Collection Time: 06/28/16  4:19 AM   Result Value Ref Range   Magnesium 2.2 1.7 - 2.4 mg/dL  Phosphorus     Status: None   Collection Time: 06/28/16  4:19 AM  Result Value Ref Range   Phosphorus 4.5 2.5 - 4.6 mg/dL    Current Facility-Administered Medications  Medication Dose Route Frequency Provider Last Rate Last Dose  . acetaminophen (TYLENOL) tablet 1,000 mg  1,000 mg Oral Q8H Shanker Kristeen Mans, MD   1,000 mg at 06/28/16 1627  . amLODipine (NORVASC) tablet 10 mg  10 mg Oral Daily [facility], DO   10 mg at 06/28/16 1139  . aspirin EC tablet 81 mg  81 mg Oral Daily [employer], DO   81 mg at 06/28/16 1139  . dextromethorphan-guaiFENesin (MUCINEX DM) 30-600 MG per 12 hr tablet 1 tablet  1 tablet Oral BID [place], DO   1 tablet at 06/28/16 1140  . enoxaparin (LOVENOX) injection 30 mg  30 mg Subcutaneous Q24H Nicholes Stairs, MD   30 mg at 06/28/16 1140  . hydrALAZINE (APRESOLINE) injection 10 mg  10 mg Intravenous Q4H PRN Bertram Savin Sheikh, DO      . hydrALAZINE (APRESOLINE) tablet 25 mg  25 mg Oral Q8H Omair Latif Sheikh, DO   25 mg at 06/28/16 1613  . HYDROmorphone (DILAUDID) injection 0.5 mg  0.5 mg Intravenous Q4H PRN Jonetta Osgood, MD   0.5 mg at 06/28/16 1630  . lactated ringers infusion   Intravenous Continuous Rica Koyanagi, MD 10 mL/hr at 06/22/16 1426    .  loratadine (CLARITIN) tablet 10 mg  10 mg Oral Daily PRN Gardiner Barefoot, NP   10 mg at 06/22/16 2238  . methocarbamol (ROBAXIN) tablet 500 mg  500 mg Oral Q6H PRN Nicholes Stairs, MD   500 mg at 06/28/16 1140   Or  . methocarbamol (ROBAXIN) 500 mg in dextrose 5 % 50 mL IVPB  500 mg Intravenous Q6H PRN Nicholes Stairs, MD      . metoCLOPramide (REGLAN) tablet 5 mg  5 mg Oral Q8H PRN Nicholes Stairs, MD       Or  . metoCLOPramide (REGLAN) injection 5 mg  5 mg Intravenous Q8H PRN Nicholes Stairs, MD      . ondansetron [facility]) injection 4 mg  4 mg Intravenous Q6H PRN Patrecia Pour, MD   4 mg at 06/21/16  2233  . ondansetron (ZOFRAN) tablet 4 mg  4 mg Oral Q6H PRN Nicholes Stairs, MD       Or  . ondansetron [facility]) injection 4 mg  4 mg Intravenous Q6H PRN Nicholes Stairs, MD      . oxyCODONE (Oxy IR/ROXICODONE) immediate release tablet 5-10 mg  5-10 mg Oral Q6H PRN Shanker Kristeen Mans, MD      . polyethylene glycol (MIRALAX / GLYCOLAX) packet 17 g  17 g Oral Daily Belkys A Regalado, MD   17 g at 06/28/16 1143  . senna-docusate (Senokot-S) tablet 1 tablet  1 tablet Oral Daily Patrecia Pour, MD   1 tablet at 06/28/16 1139    Musculoskeletal: Strength & Muscle Tone: within normal limits Gait & Station: unable to stand Patient leans: N/A  Psychiatric Specialty Exam: Physical Exam as per history and physical   ROS patient has multiple descended joint disorders, bilateral hearing impairment and status post hip replacement secondary to fall and injury. Patient knows that she needed physical rehabilitation and also has a family support. Patient denied nausea, vomiting, abdomen pain, shortness of breath and chest pain.  No Fever-chills, No Headache, No changes with Vision or hearing, reports vertigo No problems swallowing food or Liquids, No Chest pain, Cough or Shortness of Breath, No Abdominal pain, No Nausea or Vommitting, Bowel movements are regular, No Blood in stool or Urine, No dysuria, No new skin rashes or bruises, No new joints pains-aches,  No new weakness, tingling, numbness in any extremity, No recent weight gain or loss, No polyuria, polydypsia or polyphagia,  A full 10 point Review of Systems was done, except as stated above, all other Review of Systems were negative.  Blood pressure (!) 136/54, pulse 85, temperature 98.1 F (36.7 C), temperature source Oral, resp. rate 18, height _0  (1.499 m), weight 65.1 kg (143 lb 8 oz), SpO2 96 %.Body mass index is 28.98 kg/m.  General Appearance: Casual  Eye Contact:  Good  Speech:  Clear and Coherent  Volume:  Normal   Mood:  Euthymic  Affect:  Appropriate and Congruent  Thought Process:  Coherent and Goal Directed  Orientation:  Full (Time, Place, and Person)  Thought Content:  WDL  Suicidal Thoughts:  No  Homicidal Thoughts:  No  Memory:  Immediate;   Good Recent;   Fair Remote;   Fair  Judgement:  Intact  Insight:  Good  Psychomotor Activity:  Decreased  Concentration:  Concentration: Good and Attention Span: Good  Recall:  Good  Fund of Knowledge:  Good  Language:  Good  Akathisia:  Negative  Handed:  Right  AIMS (if  indicated):     Assets:  Communication Skills Desire for Improvement Financial Resources/Insurance Housing Leisure Time Resilience Social Support Transportation  ADL's:  Impaired  Cognition:  WNL  Sleep:        Treatment Plan Summary: 82 years old female with bilateral hearing impairment multiple joint disease secondary to degenerative joint disease and status post hip replacement secondary to fall and injury. Patient has no symptoms of mental illness or danger to self and others. Patient has intact cognition without any difficulties.   Based on my evaluation today patient meets criteria for capacity to make her own medical decisions and living arrangements Case discussed with the staff RN. Daily contact with patient to assess and evaluate symptoms and progress in treatment and Medication management  Appreciate psychiatric consultation and we sign off as of today Please contact [phone] or [phone] if needs further assistance   Disposition: Supportive therapy provided about ongoing stressors.  Ambrose Finland, MD 06/28/2016 5:13 PM

## 2016-06-28 NOTE — Progress Notes (Signed)
Physical Therapy Treatment Patient Details Name: Cindy Clarke MRN: [id number] DOB: 09/19/1930 Today's Date: 06/28/2016    History of Present Illness 83 y.o. female admitted with L hip fx, s/p bipolar hip hemiarthroplasty. PMH of anxiety, depression, OA.     PT Comments    Patient is making gradual progress toward mobility goals. Pt able to increased gait distance this session. Continue to progress as tolerated with anticipated d/c to SNF for further skilled PT services.    Follow Up Recommendations  SNF;Supervision/Assistance - 24 hour     Equipment Recommendations  Rolling walker with 5" wheels    Recommendations for Other Services OT consult     Precautions / Restrictions Precautions Precautions: Fall;Posterior Hip Precaution Comments: reviewed precautions; pt able to recall 3/3  Restrictions Weight Bearing Restrictions: Yes LLE Weight Bearing: Weight bearing as tolerated    Mobility  Bed Mobility Overal bed mobility: Needs Assistance Bed Mobility: Supine to Sit     Supine to sit: Min guard     General bed mobility comments: min guard for safety; cues to initiate movement  Transfers Overall transfer level: Needs assistance Equipment used: Rolling walker (2 wheeled) Transfers: Sit to/from Stand Sit to Stand: Min assist         General transfer comment: assist to power up into standing and to steady upon stand from EOB and commode with grab bar  Ambulation/Gait Ambulation/Gait assistance: Min assist Ambulation Distance (Feet):  (49ft then 61ft) Assistive device: Rolling walker (2 wheeled) Gait Pattern/deviations: Step-to pattern;Decreased stance time - left;Decreased weight shift to left;Trunk flexed;Decreased step length - right     General Gait Details: max cues for safe use of AD and multimodal cues for posture; pt with improved bilat step length and ability to WB on L LE   Stairs            Wheelchair Mobility    Modified Rankin (Stroke  Patients Only)       Balance     Sitting balance-Leahy Scale: Good     Standing balance support: Bilateral upper extremity supported Standing balance-Leahy Scale: Poor                      Cognition Arousal/Alertness: Awake/alert Behavior During Therapy: WFL for tasks assessed/performed;Anxious Overall Cognitive Status: No family/caregiver present to determine baseline cognitive functioning Area of Impairment: Safety/judgement;Problem solving         Safety/Judgement: Decreased awareness of safety;Decreased awareness of deficits   Problem Solving: Requires verbal cues;Requires tactile cues      Exercises Total Joint Exercises Heel Slides: AAROM;Left;10 reps Hip ABduction/ADduction: AAROM;Left;10 reps Long Arc Quad: AROM;Left;10 reps    General Comments        Pertinent Vitals/Pain Pain Assessment: Faces Faces Pain Scale: Hurts little more Pain Location: L hip Pain Descriptors / Indicators: Sore;Grimacing;Guarding;Moaning Pain Intervention(s): Limited activity within patient's tolerance;Monitored during session;Repositioned    Home Living                      Prior Function            PT Goals (current goals can now be found in the care plan section) Acute Rehab PT Goals Patient Stated Goal: none stated Progress towards PT goals: Progressing toward goals    Frequency    Min 3X/week      PT Plan Current plan remains appropriate    Co-evaluation             End  of Session Equipment Utilized During Treatment: Gait belt Activity Tolerance: Patient tolerated treatment well Patient left: in chair;with call bell/phone within reach     Time: 1423-1446 PT Time Calculation (min) (ACUTE ONLY): 23 min  Charges:  $Gait Training: 8-22 mins $Therapeutic Exercise: 8-22 mins                    G Codes:      Derek Mound, PTA Pager: [phone]   06/28/2016, 3:12 PM

## 2016-06-28 NOTE — Clinical Social Work Note (Signed)
CSW spoke with pt with RNCM about d/c planning. Pt refused placement in [facility] SNF and wanted to be placed in [place] [facility]. CSW explained no bed availability in [place]. Pt began crying and screaming "they are trying to control me." Pt very upset and stated she "wants to die." Psych consult requested. CSW will continue to follow.   Corlis Hove, MSW, [facility]  Clinical Social Worker  [phone]

## 2016-06-29 DIAGNOSIS — S72002S Fracture of unspecified part of neck of left femur, sequela: Secondary | ICD-10-CM | POA: Diagnosis not present

## 2016-06-29 DIAGNOSIS — S72011D Unspecified intracapsular fracture of right femur, subsequent encounter for closed fracture with routine healing: Secondary | ICD-10-CM | POA: Diagnosis not present

## 2016-06-29 DIAGNOSIS — W19XXXA Unspecified fall, initial encounter: Secondary | ICD-10-CM | POA: Diagnosis not present

## 2016-06-29 DIAGNOSIS — R531 Weakness: Secondary | ICD-10-CM | POA: Diagnosis not present

## 2016-06-29 DIAGNOSIS — I5032 Chronic diastolic (congestive) heart failure: Secondary | ICD-10-CM | POA: Diagnosis not present

## 2016-06-29 DIAGNOSIS — N183 Chronic kidney disease, stage 3 (moderate): Secondary | ICD-10-CM | POA: Diagnosis not present

## 2016-06-29 DIAGNOSIS — I519 Heart disease, unspecified: Secondary | ICD-10-CM | POA: Diagnosis not present

## 2016-06-29 DIAGNOSIS — M6281 Muscle weakness (generalized): Secondary | ICD-10-CM | POA: Diagnosis not present

## 2016-06-29 DIAGNOSIS — Z4789 Encounter for other orthopedic aftercare: Secondary | ICD-10-CM | POA: Diagnosis not present

## 2016-06-29 DIAGNOSIS — S72002A Fracture of unspecified part of neck of left femur, initial encounter for closed fracture: Secondary | ICD-10-CM | POA: Diagnosis not present

## 2016-06-29 DIAGNOSIS — M80852D Other osteoporosis with current pathological fracture, left femur, subsequent encounter for fracture with routine healing: Secondary | ICD-10-CM | POA: Diagnosis not present

## 2016-06-29 DIAGNOSIS — Z9181 History of falling: Secondary | ICD-10-CM | POA: Diagnosis not present

## 2016-06-29 DIAGNOSIS — F339 Major depressive disorder, recurrent, unspecified: Secondary | ICD-10-CM | POA: Diagnosis not present

## 2016-06-29 DIAGNOSIS — I10 Essential (primary) hypertension: Secondary | ICD-10-CM | POA: Diagnosis not present

## 2016-06-29 DIAGNOSIS — S72009A Fracture of unspecified part of neck of unspecified femur, initial encounter for closed fracture: Secondary | ICD-10-CM | POA: Diagnosis not present

## 2016-06-29 DIAGNOSIS — F22 Delusional disorders: Secondary | ICD-10-CM | POA: Diagnosis not present

## 2016-06-29 DIAGNOSIS — F418 Other specified anxiety disorders: Secondary | ICD-10-CM | POA: Diagnosis not present

## 2016-06-29 DIAGNOSIS — R2689 Other abnormalities of gait and mobility: Secondary | ICD-10-CM | POA: Diagnosis not present

## 2016-06-29 DIAGNOSIS — Z471 Aftercare following joint replacement surgery: Secondary | ICD-10-CM | POA: Diagnosis not present

## 2016-06-29 DIAGNOSIS — F419 Anxiety disorder, unspecified: Secondary | ICD-10-CM | POA: Diagnosis not present

## 2016-06-29 DIAGNOSIS — R262 Difficulty in walking, not elsewhere classified: Secondary | ICD-10-CM | POA: Diagnosis not present

## 2016-06-29 LAB — CREATININE, SERUM
CREATININE: 1.35 mg/dL — AB (ref 0.44–1.00)
GFR, EST AFRICAN AMERICAN: 41 mL/min — AB (ref >60)
GFR, EST NON AFRICAN AMERICAN: 35 mL/min — AB (ref >60)

## 2016-06-29 MED ORDER — POLYETHYLENE GLYCOL 3350 17 G PO PACK: 17.0000 g | PACK | Freq: Every day | ORAL | 0 refills | Status: AC

## 2016-06-29 MED ORDER — ZOLPIDEM TARTRATE 10 MG PO TABS: 10.0000 mg | ORAL_TABLET | Freq: Every evening | ORAL | 0 refills | Status: AC | PRN

## 2016-06-29 MED ORDER — ACETAMINOPHEN 500 MG PO TABS: 1000.0000 mg | ORAL_TABLET | Freq: Three times a day (TID) | ORAL | 0 refills | Status: AC

## 2016-06-29 MED ORDER — SENNOSIDES-DOCUSATE SODIUM 8.6-50 MG PO TABS: 1.0000 | ORAL_TABLET | Freq: Every day | ORAL | 0 refills | Status: AC

## 2016-06-29 MED ORDER — AMLODIPINE BESYLATE 10 MG PO TABS: 10.0000 mg | ORAL_TABLET | Freq: Every day | ORAL | 0 refills | Status: DC

## 2016-06-29 NOTE — Clinical Social Work Note (Signed)
Pt is ready for discharge today and will go to [facility]. Pt and daughter aware and agreeable to discharge plan. CSW sent clinicals to  [facility] and communicated with Lynden Ang and Clydie Braun (admissions) for room and report. Insurance auth confirmation #: [id number] provided to [facility]. Room and report provided to RN and put in treatment team sticky note. Transportation arranged with [facility]. CSW is signing off as no further needs identified.  Corlis Hove, MSW, [facility]  Clinical Social Worker  [phone]

## 2016-06-29 NOTE — Progress Notes (Signed)
Patient became extremely upset with nurse when PO pain medication was encouraged. She screamed out and cried, until IV medication was given. Nurse tried to educate patient on the importance of trying PO medication since she will be leaving hospital soon. Patient stated that she "won't go".

## 2016-06-29 NOTE — Progress Notes (Signed)
Pt was refusing to go to [facility]. Had SW to come and talk to pt about go to [facility]. After talking to the SW pt has agreed to go to [facility] with [facility]. Her daughter was called to let her know that the pt is being transported to [facility].

## 2016-06-29 NOTE — Progress Notes (Signed)
RN called the [facility] after hours number to see where the patient was in line. Per operator, they do not have a ETA but she is on the list.

## 2016-06-29 NOTE — Progress Notes (Signed)
CSW received call from RN re: pt's refusal to go to [facility].  By the time CSW and RNCM arrived on unit, pt was agreeable to go to [facility] via [place] [facility].  CSW/RNCM, explained to pt that she could always move to another facility with the assistance of the [facility] SW.  Pt tearful at times, stating that "no one knows the whole story" and "only I know all that's going on." RN to update pt's daughter re: tx.

## 2016-06-29 NOTE — Progress Notes (Signed)
Report called to [facility] health care. Patient is ready for discharge. Awaiting transport

## 2016-06-29 NOTE — Discharge Summary (Signed)
PATIENT DETAILS Name: Cindy Clarke Age: 83 y.o. Sex: female Date of Birth: August 31, 1930 MRN: [id number]. Admitting Physician: Tyrone Nine, MD PCP:No primary care provider on file.  Admit Date: 06/20/2016 Discharge date: 06/29/2016  Recommendations for Outpatient Follow-up:  1. Follow up with PCP in 1-2 weeks 2. Please obtain BMP/CBC in one week 3. Please ensure follow up with Orthopedics in 2 weeks   Admitted From:  Home  Disposition: SNF   Home Health: No  Equipment/Devices: None  Discharge Condition: Stable  CODE STATUS: FULL CODE  Diet recommendation:  Heart Healthy   Brief Summary: See H&P, Labs, Consult and Test reports for all details in brief, Patient is a 83 y.o. female with past medical history of hypertension, chronic diastolic heart failure admitted on 1/30 following a mechanical fall, found to have acute left subcapital femoral neck fracture. Underwent open treatment of femoral fracture on 2/1. Postoperative course has been relatively uncomplicated, awaiting SNF placement. See below for further details  Brief Hospital Course: Femoral neck fracture: Following a mechanical fall, underwent surgical intervention on 2/1. Postoperative course was relatively uncomplicated. Recommendations from Orthopedics are to continue with Mechanical VTE Prophylaxis with SCD's and TED Thigh-High and Chemical VTE Prophylaxis with BID ASA 81 mg for DVT x 1 month at D/C.Patient very hesistant to go to a SNF in [place]-no beds available in [place]-she seems to be reluctantly agreeable to go to SNF here in [place] this morning.  Chronic kidney disease stage III: Creatinine close to usual baseline. Follow periodically.  Hypertension: Controlled, continue amlodipine, hydralazine  Chronic diastolic heart failure: Clinically compensated, follow volume status.  Paranoia: Somewhat still paranoid blaming "certain people" for her not getting a SNF bed in [place]  area.  Thinking about going to her daughter's house-subsequently spoke to her daughter over the phone-apparently patient has had similar behaviors in the past-and was committed a few years ago. Seen by Psych pn 2/7-deemed to have capacity. Now reluctantly agreeable to go to SNF here in [facility] this morning.  Procedures/Studies: Echo 1/31>>  Study Conclusions - Left ventricle: The cavity size was normal. Wall thickness was increased in a pattern of mild LVH. Systolic function was normal. The estimated ejection fraction was in the range of 60% to 65%. Wall motion was normal; there were no regional wall motion abnormalities. Doppler parameters are consistent with abnormal left ventricular relaxation (grade 1 diastolic dysfunction). - Aortic valve: Mildly calcified annulus. - Pulmonary arteries: Systolic pressure was moderately increased. PA peak pressure: 46 mm Hg (S).  Open treatment of femoral fracture, proximal end, neck, prosthetic replacement [id number] 2/1>>  Discharge Diagnoses:  Principal Problem:   Femoral neck fracture (HCC) Active Problems:   Osteoarthritis   Creatinine elevation   HTN (hypertension)   Diastolic dysfunction without heart failure   Leukocytosis   Discharge Instructions:  Activity:  WBAT LLE, with posterior hip precautions  Discharge Instructions    Call MD for:  redness, tenderness, or signs of infection (pain, swelling, redness, odor or green/yellow discharge around incision site)    Complete by:  As directed    Call MD for:  severe uncontrolled pain    Complete by:  As directed    Diet - low sodium heart healthy    Complete by:  As directed    Discharge instructions    Complete by:  As directed    Mechanical VTE Prophylaxis with SCD's and TED Thigh-High and Chemical VTE Prophylaxis with BID ASA 81 mg for DVT x  1 month at D/C.  Weight Bearing as tolerated LLE, with posterior hip precautions  Follow with Primary MD  and  Dr  Yolonda Kida in 2 weeks  Please get a complete blood count and chemistry panel checked by your Primary MD at your next visit, and again as instructed by your Primary MD.  Get Medicines reviewed and adjusted: Please take all your medications with you for your next visit with your Primary MD  Laboratory/radiological data: Please request your Primary MD to go over all hospital tests and procedure/radiological results at the follow up, please ask your Primary MD to get all Hospital records sent to his/her office.  In some cases, they will be blood work, cultures and biopsy results pending at the time of your discharge. Please request that your primary care M.D. follows up on these results.  Also Note the following: If you experience worsening of your admission symptoms, develop shortness of breath, life threatening emergency, suicidal or homicidal thoughts you must seek medical attention immediately by calling 911 or calling your MD immediately  if symptoms less severe.  You must read complete instructions/literature along with all the possible adverse reactions/side effects for all the Medicines you take and that have been prescribed to you. Take any new Medicines after you have completely understood and accpet all the possible adverse reactions/side effects.   Do not drive when taking Pain medications or sleeping medications (Benzodaizepines)  Do not take more than prescribed Pain, Sleep and Anxiety Medications. It is not advisable to combine anxiety,sleep and pain medications without talking with your primary care practitioner  Special Instructions: If you have smoked or chewed Tobacco  in the last 2 yrs please stop smoking, stop any regular Alcohol  and or any Recreational drug use.  Wear Seat belts while driving.  Please note: You were cared for by a hospitalist during your hospital stay. Once you are discharged, your primary care physician will handle any further medical issues.  Please note that NO REFILLS for any discharge medications will be authorized once you are discharged, as it is imperative that you return to your primary care physician (or establish a relationship with a primary care physician if you do not have one) for your post hospital discharge needs so that they can reassess your need for medications and monitor your lab values.   Increase activity slowly    Complete by:  As directed    Weight Bearing as tolerated LLE, with posterior hip precautions     Allergies as of 06/29/2016      Reactions   Influenza Vaccines Nausea And Vomiting   Niacin Palpitations      Medication List    STOP taking these medications   naproxen sodium 220 MG tablet Commonly known as:  ANAPROX     TAKE these medications   acetaminophen 500 MG tablet Commonly known as:  TYLENOL Take 2 tablets (1,000 mg total) by mouth every 8 (eight) hours.   amLODipine 10 MG tablet Commonly known as:  NORVASC Take 1 tablet (10 mg total) by mouth daily. Start taking on:  06/30/2016   aspirin EC 81 MG tablet Take 1 tablet (81 mg total) by mouth 2 (two) times daily.   CALCIUM PO Take 1 tablet by mouth 2 (two) times daily.   CENTRUM SILVER PO Take 1 tablet by mouth daily.   OSTEO BI-FLEX ADV JOINT SHIELD PO Take 1 tablet by mouth 2 (two) times daily.   oxyCODONE 5 MG immediate release  tablet Commonly known as:  ROXICODONE Take 1-2 tablets (5-10 mg total) by mouth every 4 (four) hours as needed for severe pain.   polyethylene glycol packet Commonly known as:  MIRALAX / GLYCOLAX Take 17 g by mouth daily. Start taking on:  06/30/2016   pravastatin 40 MG tablet Commonly known as:  PRAVACHOL Take 1 tablet (40 mg total) by mouth daily.   Risedronate Sodium 35 MG Tbec Take 1 tablet (35 mg total) by mouth once a week.   senna-docusate 8.6-50 MG tablet Commonly known as:  Senokot-S Take 1 tablet by mouth daily. Start taking on:  06/30/2016   VITAMIN C PO Take 2 tablets by  mouth 2 (two) times daily.   VITAMIN D PO Take 2 tablets by mouth at bedtime.   zolpidem 10 MG tablet Commonly known as:  AMBIEN Take 1 tablet (10 mg total) by mouth at bedtime as needed.      Follow-up Information    Yolonda Kida, MD. Schedule an appointment as soon as possible for a visit in 2 week(s).   Specialty:  Orthopedic Surgery Contact information: [address] [place] [address] [phone]        primary MD. Schedule an appointment as soon as possible for a visit in 2 week(s).          Allergies  Allergen Reactions  . Influenza Vaccines Nausea And Vomiting  . Niacin Palpitations   Consultations:   orthopedic surgery   Other Procedures/Studies: Dg Chest 1 View  Result Date: 06/20/2016 CLINICAL DATA:  Acute left hip fracture EXAM: CHEST 1 VIEW COMPARISON:  05/30/2012 FINDINGS: Mild cardiomegaly with central vascular congestion. No focal pneumonia, collapse or consolidation. Negative for edema, effusion or pneumothorax. Trachea is midline. Atherosclerosis noted of the aorta. Degenerative changes noted spine. Advanced arthropathy of both shoulders with humeral head deformities. IMPRESSION: Cardiomegaly with vascular congestion. Negative for CHF or pneumonia Thoracic aortic atherosclerosis Electronically Signed   By: Judie Petit.  Shick M.D.   On: 06/20/2016 14:57   Dg Pelvis Portable  Result Date: 06/22/2016 CLINICAL DATA:  Hip fracture. EXAM: PORTABLE PELVIS 1-2 VIEWS COMPARISON:  Pelvis radiograph 10/05/2010 FINDINGS: There has been a 2 component left hip arthroplasty. The prosthetic femoral head is normally aligned with the acetabulum. No displaced fractures are seen. Hyperdense granules overlie the left thigh. Osteoarthritic changes of the right hip joint are noted. IMPRESSION: Post 2 component left hip arthroplasty without evidence of hardware complications. Electronically Signed   By: Ted Mcalpine M.D.   On: 06/22/2016 16:58   Dg Chest  Port 1 View  Result Date: 06/26/2016 CLINICAL DATA:  83 year old female with cough. Post hip replacement. Initial encounter. EXAM: PORTABLE CHEST 1 VIEW COMPARISON:  06/20/2016 and 05/30/2012 chest x-ray. FINDINGS: Mild chronic lung changes including scarring left lung apex and minimal interstitial markings left lung base without evidence of congestive heart failure, infiltrate or pneumothorax. Nodularity right upper lobe felt to be related to crossing ribs as no abnormality noted in this region on recent exam. Mild cardiomegaly. Calcified mildly tortuous aorta. Prominent bilateral shoulder joint degenerative changes greater on the left. Degenerative changes lower cervical spine. IMPRESSION: No infiltrate or congestive heart failure. Chronic lung changes as noted above. Cardiomegaly. Calcified tortuous aorta. Prominent bilateral shoulder joint degenerative changes with remodeling. Electronically Signed   By: Lacy Duverney M.D.   On: 06/26/2016 11:25   Dg Hip Unilat With Pelvis 2-3 Views Left  Result Date: 06/20/2016 CLINICAL DATA:  Recent fall, left hip  injury, acute pain EXAM: DG HIP (WITH OR WITHOUT PELVIS) 2-3V LEFT COMPARISON:  None available FINDINGS: There is an acute left hip subcapital femoral neck fracture with minimal displacement and impaction. No associated hip subluxation or dislocation. Bony pelvis appears intact. No displaced fracture. Bowel artifact overlies the rami. No diastases. Degenerative changes of the lower lumbar spine and SI joints. Right hip appears unremarkable. IMPRESSION: Acute minimally displaced and impacted left subcapital femoral neck fracture. Electronically Signed   By: Judie Petit.  Shick M.D.   On: 06/20/2016 14:50      TODAY-DAY OF DISCHARGE:  Subjective:   Evangeline Utley today has no headache,no chest abdominal pain,no new weakness tingling or numbness, feels much better wants to go home today.   Objective:   Blood pressure (!) 139/43, pulse 72, temperature 98.2 F  (36.8 C), temperature source Oral, resp. rate 18, height 4\' 11"  (1.499 m), weight 65.1 kg (143 lb 8 oz), SpO2 97 %.  Intake/Output Summary (Last 24 hours) at 06/29/16 0954 Last data filed at 06/29/16 0900  Gross per 24 hour  Intake              582 ml  Output             2050 ml  Net            -1468 ml   Filed Weights   06/20/16 1346 06/21/16 1440  Weight: 63.5 kg (140 lb) 65.1 kg (143 lb 8 oz)    Exam: Awake Alert, Oriented *3, No new F.N deficits, Normal affect [place].AT,PERRAL Supple Neck,No JVD, No cervical lymphadenopathy appriciated.  Symmetrical Chest wall movement, Good air movement bilaterally, CTAB RRR,No Gallops,Rubs or new Murmurs, No Parasternal Heave +ve B.Sounds, Abd Soft, Non tender, No organomegaly appriciated, No rebound -guarding or rigidity. No Cyanosis, Clubbing or edema, No new Rash or bruise   PERTINENT RADIOLOGIC STUDIES: Dg Chest 1 View  Result Date: 06/20/2016 CLINICAL DATA:  Acute left hip fracture EXAM: CHEST 1 VIEW COMPARISON:  05/30/2012 FINDINGS: Mild cardiomegaly with central vascular congestion. No focal pneumonia, collapse or consolidation. Negative for edema, effusion or pneumothorax. Trachea is midline. Atherosclerosis noted of the aorta. Degenerative changes noted spine. Advanced arthropathy of both shoulders with humeral head deformities. IMPRESSION: Cardiomegaly with vascular congestion. Negative for CHF or pneumonia Thoracic aortic atherosclerosis Electronically Signed   By: Judie Petit.  Shick M.D.   On: 06/20/2016 14:57   Dg Pelvis Portable  Result Date: 06/22/2016 CLINICAL DATA:  Hip fracture. EXAM: PORTABLE PELVIS 1-2 VIEWS COMPARISON:  Pelvis radiograph 10/05/2010 FINDINGS: There has been a 2 component left hip arthroplasty. The prosthetic femoral head is normally aligned with the acetabulum. No displaced fractures are seen. Hyperdense granules overlie the left thigh. Osteoarthritic changes of the right hip joint are noted. IMPRESSION: Post 2 component  left hip arthroplasty without evidence of hardware complications. Electronically Signed   By: Ted Mcalpine M.D.   On: 06/22/2016 16:58   Dg Chest Port 1 View  Result Date: 06/26/2016 CLINICAL DATA:  83 year old female with cough. Post hip replacement. Initial encounter. EXAM: PORTABLE CHEST 1 VIEW COMPARISON:  06/20/2016 and 05/30/2012 chest x-ray. FINDINGS: Mild chronic lung changes including scarring left lung apex and minimal interstitial markings left lung base without evidence of congestive heart failure, infiltrate or pneumothorax. Nodularity right upper lobe felt to be related to crossing ribs as no abnormality noted in this region on recent exam. Mild cardiomegaly. Calcified mildly tortuous aorta. Prominent bilateral shoulder joint degenerative changes greater on the left.  Degenerative changes lower cervical spine. IMPRESSION: No infiltrate or congestive heart failure. Chronic lung changes as noted above. Cardiomegaly. Calcified tortuous aorta. Prominent bilateral shoulder joint degenerative changes with remodeling. Electronically Signed   By: Lacy Duverney M.D.   On: 06/26/2016 11:25   Dg Hip Unilat With Pelvis 2-3 Views Left  Result Date: 06/20/2016 CLINICAL DATA:  Recent fall, left hip injury, acute pain EXAM: DG HIP (WITH OR WITHOUT PELVIS) 2-3V LEFT COMPARISON:  None available FINDINGS: There is an acute left hip subcapital femoral neck fracture with minimal displacement and impaction. No associated hip subluxation or dislocation. Bony pelvis appears intact. No displaced fracture. Bowel artifact overlies the rami. No diastases. Degenerative changes of the lower lumbar spine and SI joints. Right hip appears unremarkable. IMPRESSION: Acute minimally displaced and impacted left subcapital femoral neck fracture. Electronically Signed   By: Judie Petit.  Shick M.D.   On: 06/20/2016 14:50     PERTINENT LAB RESULTS: CBC:  Recent Labs  06/27/16 0728 06/28/16 0419  WBC 9.0 9.0  HGB 11.2* 11.3*    HCT 33.9* 33.8*  PLT 324 364   CMET CMP     Component Value Date/Time   NA 136 06/28/2016 0419   K 4.0 06/28/2016 0419   CL 99 (L) 06/28/2016 0419   CO2 26 06/28/2016 0419   GLUCOSE 100 (H) 06/28/2016 0419   BUN 27 (H) 06/28/2016 0419   CREATININE 1.35 (H) 06/29/2016 0450   CALCIUM 9.2 06/28/2016 0419   PROT 6.0 (L) 06/28/2016 0419   ALBUMIN 2.6 (L) 06/28/2016 0419   AST 37 06/28/2016 0419   ALT 25 06/28/2016 0419   ALKPHOS 77 06/28/2016 0419   BILITOT 0.5 06/28/2016 0419   GFRNONAA 35 (L) 06/29/2016 0450   GFRAA 41 (L) 06/29/2016 0450    GFR Estimated Creatinine Clearance: 25.9 mL/min (by C-G formula based on SCr of 1.35 mg/dL (H)). No results for input(s): LIPASE, AMYLASE in the last 72 hours. No results for input(s): CKTOTAL, CKMB, CKMBINDEX, TROPONINI in the last 72 hours. Invalid input(s): POCBNP No results for input(s): DDIMER in the last 72 hours. No results for input(s): HGBA1C in the last 72 hours. No results for input(s): CHOL, HDL, LDLCALC, TRIG, CHOLHDL, LDLDIRECT in the last 72 hours. No results for input(s): TSH, T4TOTAL, T3FREE, THYROIDAB in the last 72 hours.  Invalid input(s): FREET3 No results for input(s): VITAMINB12, FOLATE, FERRITIN, TIBC, IRON, RETICCTPCT in the last 72 hours. Coags: No results for input(s): INR in the last 72 hours.  Invalid input(s): PT Microbiology: Recent Results (from the past 240 hour(s))  Surgical pcr screen     Status: Abnormal   Collection Time: 06/21/16  5:00 AM  Result Value Ref Range Status   MRSA, PCR NEGATIVE NEGATIVE Final   Staphylococcus aureus POSITIVE (A) NEGATIVE Final    Comment:        The Xpert SA Assay (FDA approved for NASAL specimens in patients over 46 years of age), is one component of a comprehensive surveillance program.  Test performance has been validated by [facility] for patients greater than or equal to 33 year old. It is not intended to diagnose infection nor to guide or monitor  treatment.     FURTHER DISCHARGE INSTRUCTIONS:  Get Medicines reviewed and adjusted: Please take all your medications with you for your next visit with your Primary MD  Laboratory/radiological data: Please request your Primary MD to go over all hospital tests and procedure/radiological results at the follow up, please ask your  Primary MD to get all Hospital records sent to his/her office.  In some cases, they will be blood work, cultures and biopsy results pending at the time of your discharge. Please request that your primary care M.D. goes through all the records of your hospital data and follows up on these results.  Also Note the following: If you experience worsening of your admission symptoms, develop shortness of breath, life threatening emergency, suicidal or homicidal thoughts you must seek medical attention immediately by calling 911 or calling your MD immediately  if symptoms less severe.  You must read complete instructions/literature along with all the possible adverse reactions/side effects for all the Medicines you take and that have been prescribed to you. Take any new Medicines after you have completely understood and accpet all the possible adverse reactions/side effects.   Do not drive when taking Pain medications or sleeping medications (Benzodaizepines)  Do not take more than prescribed Pain, Sleep and Anxiety Medications. It is not advisable to combine anxiety,sleep and pain medications without talking with your primary care practitioner  Special Instructions: If you have smoked or chewed Tobacco  in the last 2 yrs please stop smoking, stop any regular Alcohol  and or any Recreational drug use.  Wear Seat belts while driving.  Please note: You were cared for by a hospitalist during your hospital stay. Once you are discharged, your primary care physician will handle any further medical issues. Please note that NO REFILLS for any discharge medications will be  authorized once you are discharged, as it is imperative that you return to your primary care physician (or establish a relationship with a primary care physician if you do not have one) for your post hospital discharge needs so that they can reassess your need for medications and monitor your lab values.  Total Time spent coordinating discharge including counseling, education and face to face time equals 45 minutes.  SignedJeoffrey Massed 06/29/2016 9:54 AM

## 2016-06-30 DIAGNOSIS — I10 Essential (primary) hypertension: Secondary | ICD-10-CM | POA: Diagnosis not present

## 2016-06-30 DIAGNOSIS — I5032 Chronic diastolic (congestive) heart failure: Secondary | ICD-10-CM | POA: Diagnosis not present

## 2016-06-30 DIAGNOSIS — S72011D Unspecified intracapsular fracture of right femur, subsequent encounter for closed fracture with routine healing: Secondary | ICD-10-CM | POA: Diagnosis not present

## 2016-06-30 DIAGNOSIS — F418 Other specified anxiety disorders: Secondary | ICD-10-CM | POA: Diagnosis not present

## 2016-07-04 DIAGNOSIS — R262 Difficulty in walking, not elsewhere classified: Secondary | ICD-10-CM | POA: Diagnosis not present

## 2016-07-04 DIAGNOSIS — W19XXXA Unspecified fall, initial encounter: Secondary | ICD-10-CM | POA: Diagnosis not present

## 2016-07-04 DIAGNOSIS — M6281 Muscle weakness (generalized): Secondary | ICD-10-CM | POA: Diagnosis not present

## 2016-07-05 DIAGNOSIS — F339 Major depressive disorder, recurrent, unspecified: Secondary | ICD-10-CM | POA: Diagnosis not present

## 2016-07-05 DIAGNOSIS — F419 Anxiety disorder, unspecified: Secondary | ICD-10-CM | POA: Diagnosis not present

## 2016-10-20 NOTE — Addendum Note (Signed)
Addendum  created 10/20/16 1022 by Morio Widen, MD   Sign clinical note    

## 2017-08-04 ENCOUNTER — Other Ambulatory Visit: Payer: Self-pay

## 2017-08-04 ENCOUNTER — Encounter (HOSPITAL_COMMUNITY): Payer: Self-pay

## 2017-08-04 ENCOUNTER — Emergency Department (HOSPITAL_COMMUNITY): Payer: Medicare HMO

## 2017-08-04 ENCOUNTER — Emergency Department (HOSPITAL_COMMUNITY)
Admission: EM | Admit: 2017-08-04 | Discharge: 2017-08-05 | Disposition: A | Discharge status: Home / Self Care | Payer: Medicare HMO | Attending: Emergency Medicine | Admitting: Emergency Medicine

## 2017-08-04 DIAGNOSIS — R600 Localized edema: Secondary | ICD-10-CM

## 2017-08-04 DIAGNOSIS — I110 Hypertensive heart disease with heart failure: Secondary | ICD-10-CM | POA: Insufficient documentation

## 2017-08-04 DIAGNOSIS — M79605 Pain in left leg: Secondary | ICD-10-CM | POA: Insufficient documentation

## 2017-08-04 DIAGNOSIS — Z79899 Other long term (current) drug therapy: Secondary | ICD-10-CM | POA: Insufficient documentation

## 2017-08-04 DIAGNOSIS — Z9114 Patient's other noncompliance with medication regimen: Secondary | ICD-10-CM | POA: Insufficient documentation

## 2017-08-04 DIAGNOSIS — Z96642 Presence of left artificial hip joint: Secondary | ICD-10-CM | POA: Diagnosis not present

## 2017-08-04 DIAGNOSIS — I5030 Unspecified diastolic (congestive) heart failure: Secondary | ICD-10-CM | POA: Diagnosis not present

## 2017-08-04 DIAGNOSIS — Z590 Homelessness: Secondary | ICD-10-CM | POA: Diagnosis not present

## 2017-08-04 DIAGNOSIS — M79604 Pain in right leg: Secondary | ICD-10-CM | POA: Insufficient documentation

## 2017-08-04 DIAGNOSIS — I10 Essential (primary) hypertension: Secondary | ICD-10-CM | POA: Diagnosis not present

## 2017-08-04 LAB — CBC WITH DIFFERENTIAL/PLATELET
BASOS ABS: 0.1 10*3/uL (ref 0.0–0.1)
Basophils Relative: 1 %
EOS PCT: 3 %
Eosinophils Absolute: 0.3 10*3/uL (ref 0.0–0.7)
HCT: 37.6 % (ref 36.0–46.0)
HEMOGLOBIN: 11.7 g/dL — AB (ref 12.0–15.0)
LYMPHS ABS: 3 10*3/uL (ref 0.7–4.0)
LYMPHS PCT: 28 %
MCH: 27.4 pg (ref 26.0–34.0)
MCHC: 31.1 g/dL (ref 30.0–36.0)
MCV: 88.1 fL (ref 78.0–100.0)
Monocytes Absolute: 0.9 10*3/uL (ref 0.1–1.0)
Monocytes Relative: 8 %
Neutro Abs: 6.2 10*3/uL (ref 1.7–7.7)
Neutrophils Relative %: 60 %
PLATELETS: 401 10*3/uL — AB (ref 150–400)
RBC: 4.27 MIL/uL (ref 3.87–5.11)
RDW: 16.5 % — ABNORMAL HIGH (ref 11.5–15.5)
WBC: 10.5 10*3/uL (ref 4.0–10.5)

## 2017-08-04 LAB — COMPREHENSIVE METABOLIC PANEL
ALK PHOS: 79 U/L (ref 38–126)
ALT: 15 U/L (ref 14–54)
AST: 22 U/L (ref 15–41)
Albumin: 3.7 g/dL (ref 3.5–5.0)
Anion gap: 9 (ref 5–15)
BUN: 27 mg/dL — ABNORMAL HIGH (ref 6–20)
CALCIUM: 9.3 mg/dL (ref 8.9–10.3)
CHLORIDE: 107 mmol/L (ref 101–111)
CO2: 26 mmol/L (ref 22–32)
CREATININE: 1.52 mg/dL — AB (ref 0.44–1.00)
GFR, EST AFRICAN AMERICAN: 35 mL/min — AB (ref >60)
GFR, EST NON AFRICAN AMERICAN: 30 mL/min — AB (ref >60)
Glucose, Bld: 99 mg/dL (ref 65–99)
Potassium: 3.8 mmol/L (ref 3.5–5.1)
SODIUM: 142 mmol/L (ref 135–145)
Total Bilirubin: 0.4 mg/dL (ref 0.3–1.2)
Total Protein: 7 g/dL (ref 6.5–8.1)

## 2017-08-04 LAB — I-STAT TROPONIN, ED: Troponin i, poc: 0.01 ng/mL (ref 0.00–0.08)

## 2017-08-04 MED ORDER — AMLODIPINE BESYLATE 5 MG PO TABS
10.0000 mg | ORAL_TABLET | Freq: Once | ORAL | Status: AC
  Administered 2017-08-04: 10 mg via ORAL
  Filled 2017-08-04: qty 2

## 2017-08-04 NOTE — ED Triage Notes (Signed)
Pt is AOx4, Pt comes from home. Pt is having bilateral lower extremity edema. Pt stated she is homeless. Pt stated she has been homeless for about 2 weeks now and believes this is why she has the edema from sleeping in a sitting position in her car. Pt appears to be very anxious, upset and is requesting help due to housing situation as well. Pt son is here as well being seen.

## 2017-08-05 LAB — URINALYSIS, ROUTINE W REFLEX MICROSCOPIC
Bilirubin Urine: NEGATIVE
GLUCOSE, UA: NEGATIVE mg/dL
Hgb urine dipstick: NEGATIVE
Ketones, ur: NEGATIVE mg/dL
NITRITE: NEGATIVE
PH: 6.5 (ref 5.0–8.0)
Protein, ur: 30 mg/dL — AB
SPECIFIC GRAVITY, URINE: 1.02 (ref 1.005–1.030)

## 2017-08-05 LAB — URINALYSIS, MICROSCOPIC (REFLEX): BACTERIA UA: NONE SEEN

## 2017-08-05 LAB — BRAIN NATRIURETIC PEPTIDE: B Natriuretic Peptide: 33.4 pg/mL (ref 0.0–100.0)

## 2017-08-05 MED ORDER — ACETAMINOPHEN 325 MG PO TABS
650.0000 mg | ORAL_TABLET | Freq: Once | ORAL | Status: DC
  Filled 2017-08-05: qty 2

## 2017-08-05 MED ORDER — AMLODIPINE BESYLATE 10 MG PO TABS: 10.0000 mg | ORAL_TABLET | Freq: Every day | ORAL | 0 refills | Status: AC

## 2017-08-05 NOTE — ED Provider Notes (Signed)
[facility]-EMERGENCY DEPT Provider Note   CSN: [id number] Arrival date & time: 08/04/17  1622     History   Chief Complaint Chief Complaint  Patient presents with  . Leg Swelling    HPI Cindy Clarke is a 84 y.o. female past medical history of hyperlipidemia, hypertension who presents for evaluation of 2 weeks of progressively worsening bilateral lower extremity edema.  Patient reports she does have associated pain to both lower extremities.  Patient states that she is currently living in her car and feels like the swelling is from being in a position too long.  Patient states that she has not had any overlying warmth or erythema noted.  She still able to ambulate but does note with some worsening pain with ambulation.  She has not taken any analgesics for the pain.  Patient states that she has not been to a primary care doctor in several months.  She reports that she is not currently taking any of her prescribed medications.  Patient denies any fevers, chest pain, difficulty breathing, abdominal pain, nausea/vomiting, numbness/weakness of her arms or legs, vision changes, headache.  The history is provided by the patient.    Past Medical History:  Diagnosis Date  . Anxiety and depression 10/12/2010  . Hyperlipidemia   . Osteoarthritis   . Osteoporosis     Patient Active Problem List   Diagnosis Date Noted  . Diastolic dysfunction without heart failure 06/22/2016  . Leukocytosis 06/22/2016  . Femoral neck fracture (HCC) 06/20/2016  . Creatinine elevation 06/20/2016  . HTN (hypertension) 06/20/2016  . Other abnormal glucose 03/18/2013  . Need for prophylactic vaccination against Streptococcus pneumoniae (pneumococcus) 03/18/2013  . Routine general medical examination at a health care facility 03/18/2013  . Other screening mammogram 03/17/2013  . Hard of hearing 05/29/2012  . Thoracic vertebral fracture (HCC) 10/26/2010  . Insomnia 09/15/2010  .  Allergic rhinitis due to other allergen 04/21/2010  . Hyperlipidemia 12/30/2009  . Osteoarthritis 12/30/2009  . OSTEOPOROSIS 12/30/2009    Past Surgical History:  Procedure Laterality Date  . ABDOMINAL HYSTERECTOMY    . HIP ARTHROPLASTY Left 06/22/2016   Procedure: ARTHROPLASTY BIPOLAR HIP (HEMIARTHROPLASTY);  Surgeon: Yolonda Kida, MD;  Location: [facility] OR;  Service: Orthopedics;  Laterality: Left;    OB History    No data available       Home Medications    Prior to Admission medications   Medication Sig Start Date End Date Taking? Authorizing Provider  Ascorbic Acid (VITAMIN C PO) Take 2 tablets by mouth 2 (two) times daily.   Yes [provider]  CALCIUM PO Take 1 tablet by mouth 2 (two) times daily.   Yes [provider]  Cholecalciferol (VITAMIN D PO) Take 2 tablets by mouth at bedtime.    Yes [provider]  Misc Natural Products (OSTEO BI-FLEX ADV JOINT SHIELD PO) Take 1 tablet by mouth 2 (two) times daily.    Yes [provider]  Multiple Vitamins-Minerals (CENTRUM SILVER PO) Take 1 tablet by mouth daily.    Yes [provider]  acetaminophen (TYLENOL) 500 MG tablet Take 2 tablets (1,000 mg total) by mouth every 8 (eight) hours. Patient not taking: Reported on 08/05/2017 06/29/16   Maretta Bees, MD  amLODipine (NORVASC) 10 MG tablet Take 1 tablet (10 mg total) by mouth daily. 08/05/17 09/04/17  Graciella Freer A, PA-C  polyethylene glycol (MIRALAX / GLYCOLAX) packet Take 17 g by mouth daily. Patient not taking: Reported on  08/05/2017 06/30/16   Maretta Bees, MD  pravastatin (PRAVACHOL) 40 MG tablet Take 1 tablet (40 mg total) by mouth daily. Patient not taking: Reported on 06/20/2016 03/17/13   Etta Grandchild, MD  Risedronate Sodium 35 MG TBEC Take 1 tablet (35 mg total) by mouth once a week. Patient not taking: Reported on 06/20/2016 03/17/13   Etta Grandchild, MD  senna-docusate (SENOKOT-S) 8.6-50 MG tablet Take 1  tablet by mouth daily. Patient not taking: Reported on 08/05/2017 06/30/16   Maretta Bees, MD  zolpidem (AMBIEN) 10 MG tablet Take 1 tablet (10 mg total) by mouth at bedtime as needed. Patient not taking: Reported on 08/05/2017 06/29/16   Maretta Bees, MD    Family History Family History  Problem Relation Age of Onset  . Arthritis Other   . Hyperlipidemia Other     Social History Social History   Tobacco Use  . Smoking status: Never Smoker  . Smokeless tobacco: Never Used  . Tobacco comment: Regular Exercise-No  Substance Use Topics  . Alcohol use: No  . Drug use: No     Allergies   Influenza vaccines and Niacin   Review of Systems Review of Systems  Constitutional: Negative for chills and fever.  HENT: Negative for congestion.   Eyes: Negative for visual disturbance.  Respiratory: Negative for cough and shortness of breath.   Cardiovascular: Positive for leg swelling. Negative for chest pain.  Gastrointestinal: Negative for abdominal pain, diarrhea, nausea and vomiting.  Genitourinary: Negative for dysuria and hematuria.  Musculoskeletal: Negative for back pain and neck pain.  Skin: Negative for color change.  Neurological: Negative for dizziness, weakness, numbness and headaches.  All other systems reviewed and are negative.    Physical Exam Updated Vital Signs BP (!) 189/76   Pulse 91   Temp 98 F (36.7 C) (Oral)   Resp 18   Ht 4\' 11"  (1.499 m)   Wt 65.8 kg (145 lb)   SpO2 93%   BMI 29.29 kg/m   Physical Exam  Constitutional: She is oriented to person, place, and time. She appears well-developed and well-nourished.  HENT:  Head: Normocephalic and atraumatic.  Mouth/Throat: Oropharynx is clear and moist and mucous membranes are normal.  Eyes: Conjunctivae, EOM and lids are normal. Pupils are equal, round, and reactive to light.  Neck: Full passive range of motion without pain.  Cardiovascular: Normal rate, regular rhythm and normal heart  sounds. Exam reveals no gallop and no friction rub.  No murmur heard. Pulses:      Dorsalis pedis pulses are 2+ on the right side, and 2+ on the left side.  Pulmonary/Chest: Effort normal and breath sounds normal.  No evidence of respiratory distress. Able to speak in full sentences without difficulty.  Abdominal: Soft. Normal appearance. There is no tenderness. There is no rigidity and no guarding.  Musculoskeletal: Normal range of motion.  2+ pitting edema noted to the bilateral lower extremities that begins at the mid calf region and extends distally.  No overlying warmth, swelling.  Neurological: She is alert and oriented to person, place, and time.  Cranial nerves III-XII intact Follows commands, Moves all extremities  5/5 strength to BUE and BLE  Sensation intact throughout all major nerve distributions Normal finger to nose. No dysdiadochokinesia. No pronator drift. No gait abnormalities  No slurred speech. No facial droop.   Skin: Skin is warm and dry. Capillary refill takes less than 2 seconds.  Good distal cap refill. BLE are  not dusky in appearance or cool to touch.  Psychiatric: She has a normal mood and affect. Her speech is normal.  Nursing note and vitals reviewed.    ED Treatments / Results  Labs (all labs ordered are listed, but only abnormal results are displayed) Labs Reviewed  CBC WITH DIFFERENTIAL/PLATELET - Abnormal; Notable for the following components:      Result Value   Hemoglobin 11.7 (*)    RDW 16.5 (*)    Platelets 401 (*)    All other components within normal limits  COMPREHENSIVE METABOLIC PANEL - Abnormal; Notable for the following components:   BUN 27 (*)    Creatinine, Ser 1.52 (*)    GFR calc non Af Amer 30 (*)    GFR calc Af Amer 35 (*)    All other components within normal limits  URINALYSIS, ROUTINE W REFLEX MICROSCOPIC - Abnormal; Notable for the following components:   Protein, ur 30 (*)    Leukocytes, UA TRACE (*)    All other  components within normal limits  URINALYSIS, MICROSCOPIC (REFLEX) - Abnormal; Notable for the following components:   Squamous Epithelial / LPF 0-5 (*)    All other components within normal limits  BRAIN NATRIURETIC PEPTIDE  I-STAT TROPONIN, ED    EKG  EKG Interpretation  Date/Time:  Saturday August 04 2017 23:14:57 EDT Ventricular Rate:  73 PR Interval:    QRS Duration: 75 QT Interval:  405 QTC Calculation: 447 R Axis:   54 Text Interpretation:  Sinus rhythm Atrial premature complex No significant change since last tracing Confirmed by Richardean Canal [phone]) on 08/05/2017 12:07:43 AM       Radiology Dg Chest 2 View  Result Date: 08/04/2017 CLINICAL DATA:  Lower extremity edema.  Hypertension. EXAM: CHEST - 2 VIEW COMPARISON:  June 26, 2016 FINDINGS: There is no edema or consolidation. Heart size and pulmonary vascularity are normal. No adenopathy. There is aortic atherosclerosis. There is degenerative change in each shoulder with evidence of avascular necrosis in each humeral head. IMPRESSION: No edema or consolidation. Heart size normal. There is aortic atherosclerosis. Avascular necrosis noted in each humeral head. Aortic Atherosclerosis (ICD10-I70.0). Electronically Signed   By: Bretta Bang III M.D.   On: 08/04/2017 23:42    Procedures Procedures (including critical care time)  Medications Ordered in ED Medications  amLODipine (NORVASC) tablet 10 mg (10 mg Oral Given 08/04/17 2302)     Initial Impression / Assessment and Plan / ED Course  I have reviewed the triage vital signs and the nursing notes.  Pertinent labs & imaging results that were available during my care of the patient were reviewed by me and considered in my medical decision making (see chart for details).     84 y.o. F with PMH/o HTN who presents for evaluation of 2 weeks of BLE edema. No warmth, erythema. No injury.  No chest pain, difficulty breathing, fevers. Patient is afebrile, non-toxic  appearing, sitting comfortably on examination table. Vital signs reviewed.  Patient is currently hypertensive.  She is not having any headache, chest pain, numbness/weakness of her extremities.  She does have a history of hypertension.  Her chart mentions notes.  Patient reports that she has not been taking her medication.  On exam, patient does have 2+ bilateral lower extremity edema.  No vomiting or neurological exam not concerning for DVT, septic arthritis, cellulitis, acute arterial embolism of bilateral lower extremity.  Patient does have a history of heart failure and her fluid on  her legs could be related to overload but no other signs of heat overload on exam.  This is dependent edema and has been sitting in her car for helping.  We will plan to check labs, EKG, CXR.  Will plan to give patient a dose of antihypertensive medications  He unremarkable.  Troponin is negative.  BMP is unremarkable.  CMP shows slight elevation in creatinine but this appears to be consistent with patient's baseline.  CBC shows slight anemia but appears to be consistent with previous.  No leukocytosis.  UA is negative for any acute infectious etiology.  Chest x-ray negative for any acute infectious etiology.    Discussed results with patient.  Her blood  pressure has improved after.  Patient is ablating in the department.  Patient is scheduled for discharge at this time.  We will plan to refill her prescription for her blood pressure medication at home supportive therapies.  Patient to follow-up with primary care doctor in the next 24-48 hours for further evaluation. Patient had ample opportunity for questions and discussion. All patient's questions were answered with full understanding. Strict return precautions discussed. Patient expresses understanding and agreement to plan.    Final Clinical Impressions(s) / ED Diagnoses   Final diagnoses:  Leg edema    ED Discharge Orders        Ordered    amLODipine (NORVASC) 10  MG tablet  Daily     08/05/17 0131       Maxwell Caul, PA-C 08/05/17 2137    Charlynne Pander, MD 08/05/17 [phone]

## 2017-08-05 NOTE — Discharge Instructions (Signed)
You can take Tylenol as directed for pain.  You can take 1000 mg 3 times a day.  Do not exceed 4000 mg of Tylenol a day.  As we discussed trying to elevate your legs to help with the swelling.  I provided a refill prescription for your high blood pressure medication.  Follow-up with the [facility] wellness clinic for further evaluation in the next 2-4 days.  Return to the emergency department for any worsening swelling, pain, redness of the legs, fever, chest pain, difficulty breathing or any other worsening or concerning symptoms.

## 2017-09-20 DIAGNOSIS — R9439 Abnormal result of other cardiovascular function study: Secondary | ICD-10-CM | POA: Diagnosis not present

## 2017-09-20 DIAGNOSIS — I441 Atrioventricular block, second degree: Secondary | ICD-10-CM | POA: Diagnosis not present

## 2017-09-20 DIAGNOSIS — I2510 Atherosclerotic heart disease of native coronary artery without angina pectoris: Secondary | ICD-10-CM | POA: Diagnosis not present

## 2017-09-20 DIAGNOSIS — I442 Atrioventricular block, complete: Secondary | ICD-10-CM | POA: Diagnosis not present

## 2017-09-20 DIAGNOSIS — I213 ST elevation (STEMI) myocardial infarction of unspecified site: Secondary | ICD-10-CM | POA: Diagnosis not present

## 2017-09-20 DIAGNOSIS — F22 Delusional disorders: Secondary | ICD-10-CM | POA: Diagnosis not present

## 2017-09-20 DIAGNOSIS — F200 Paranoid schizophrenia: Secondary | ICD-10-CM | POA: Diagnosis not present

## 2017-09-20 DIAGNOSIS — I214 Non-ST elevation (NSTEMI) myocardial infarction: Secondary | ICD-10-CM | POA: Diagnosis not present

## 2017-09-20 DIAGNOSIS — F419 Anxiety disorder, unspecified: Secondary | ICD-10-CM | POA: Diagnosis not present

## 2017-09-20 DIAGNOSIS — I2119 ST elevation (STEMI) myocardial infarction involving other coronary artery of inferior wall: Secondary | ICD-10-CM | POA: Diagnosis not present

## 2017-09-20 DIAGNOSIS — G3183 Dementia with Lewy bodies: Secondary | ICD-10-CM | POA: Diagnosis not present

## 2017-09-20 DIAGNOSIS — I440 Atrioventricular block, first degree: Secondary | ICD-10-CM | POA: Diagnosis not present

## 2017-09-20 DIAGNOSIS — I10 Essential (primary) hypertension: Secondary | ICD-10-CM | POA: Diagnosis not present

## 2017-09-20 DIAGNOSIS — I129 Hypertensive chronic kidney disease with stage 1 through stage 4 chronic kidney disease, or unspecified chronic kidney disease: Secondary | ICD-10-CM | POA: Diagnosis not present

## 2017-09-20 DIAGNOSIS — E785 Hyperlipidemia, unspecified: Secondary | ICD-10-CM | POA: Diagnosis not present

## 2017-09-20 DIAGNOSIS — I491 Atrial premature depolarization: Secondary | ICD-10-CM | POA: Diagnosis not present

## 2017-09-20 DIAGNOSIS — R9431 Abnormal electrocardiogram [ECG] [EKG]: Secondary | ICD-10-CM | POA: Diagnosis not present

## 2017-09-20 DIAGNOSIS — R531 Weakness: Secondary | ICD-10-CM | POA: Diagnosis not present

## 2017-09-20 DIAGNOSIS — I361 Nonrheumatic tricuspid (valve) insufficiency: Secondary | ICD-10-CM | POA: Diagnosis not present

## 2017-09-20 DIAGNOSIS — I517 Cardiomegaly: Secondary | ICD-10-CM | POA: Diagnosis not present

## 2017-09-20 DIAGNOSIS — F0280 Dementia in other diseases classified elsewhere without behavioral disturbance: Secondary | ICD-10-CM | POA: Diagnosis not present

## 2017-09-20 DIAGNOSIS — I498 Other specified cardiac arrhythmias: Secondary | ICD-10-CM | POA: Diagnosis not present

## 2017-09-20 DIAGNOSIS — I340 Nonrheumatic mitral (valve) insufficiency: Secondary | ICD-10-CM | POA: Diagnosis not present

## 2017-09-29 DIAGNOSIS — R0902 Hypoxemia: Secondary | ICD-10-CM | POA: Diagnosis not present

## 2017-09-29 DIAGNOSIS — R58 Hemorrhage, not elsewhere classified: Secondary | ICD-10-CM | POA: Diagnosis not present

## 2017-09-29 DIAGNOSIS — R1111 Vomiting without nausea: Secondary | ICD-10-CM | POA: Diagnosis not present

## 2017-09-29 DIAGNOSIS — I959 Hypotension, unspecified: Secondary | ICD-10-CM | POA: Diagnosis not present

## 2017-09-30 DIAGNOSIS — F200 Paranoid schizophrenia: Secondary | ICD-10-CM | POA: Diagnosis not present

## 2017-09-30 DIAGNOSIS — I213 ST elevation (STEMI) myocardial infarction of unspecified site: Secondary | ICD-10-CM | POA: Diagnosis not present

## 2017-09-30 DIAGNOSIS — N179 Acute kidney failure, unspecified: Secondary | ICD-10-CM | POA: Diagnosis not present

## 2017-09-30 DIAGNOSIS — K254 Chronic or unspecified gastric ulcer with hemorrhage: Secondary | ICD-10-CM | POA: Diagnosis not present

## 2017-09-30 DIAGNOSIS — M25461 Effusion, right knee: Secondary | ICD-10-CM | POA: Diagnosis not present

## 2017-09-30 DIAGNOSIS — D62 Acute posthemorrhagic anemia: Secondary | ICD-10-CM | POA: Diagnosis not present

## 2017-09-30 DIAGNOSIS — I2119 ST elevation (STEMI) myocardial infarction involving other coronary artery of inferior wall: Secondary | ICD-10-CM | POA: Diagnosis not present

## 2017-09-30 DIAGNOSIS — K922 Gastrointestinal hemorrhage, unspecified: Secondary | ICD-10-CM | POA: Diagnosis not present

## 2017-09-30 DIAGNOSIS — I129 Hypertensive chronic kidney disease with stage 1 through stage 4 chronic kidney disease, or unspecified chronic kidney disease: Secondary | ICD-10-CM | POA: Diagnosis not present

## 2017-09-30 DIAGNOSIS — E7800 Pure hypercholesterolemia, unspecified: Secondary | ICD-10-CM | POA: Diagnosis not present

## 2017-09-30 DIAGNOSIS — I441 Atrioventricular block, second degree: Secondary | ICD-10-CM | POA: Diagnosis not present

## 2017-09-30 DIAGNOSIS — K449 Diaphragmatic hernia without obstruction or gangrene: Secondary | ICD-10-CM | POA: Diagnosis not present

## 2017-09-30 DIAGNOSIS — R9431 Abnormal electrocardiogram [ECG] [EKG]: Secondary | ICD-10-CM | POA: Diagnosis not present

## 2017-09-30 DIAGNOSIS — I252 Old myocardial infarction: Secondary | ICD-10-CM | POA: Diagnosis not present

## 2017-09-30 DIAGNOSIS — M1711 Unilateral primary osteoarthritis, right knee: Secondary | ICD-10-CM | POA: Diagnosis not present

## 2017-09-30 DIAGNOSIS — T82855A Stenosis of coronary artery stent, initial encounter: Secondary | ICD-10-CM | POA: Diagnosis not present

## 2017-09-30 DIAGNOSIS — M25561 Pain in right knee: Secondary | ICD-10-CM | POA: Diagnosis not present

## 2017-09-30 DIAGNOSIS — I2510 Atherosclerotic heart disease of native coronary artery without angina pectoris: Secondary | ICD-10-CM | POA: Diagnosis not present

## 2017-09-30 DIAGNOSIS — I440 Atrioventricular block, first degree: Secondary | ICD-10-CM | POA: Diagnosis not present

## 2017-09-30 DIAGNOSIS — I214 Non-ST elevation (NSTEMI) myocardial infarction: Secondary | ICD-10-CM | POA: Diagnosis not present

## 2017-09-30 DIAGNOSIS — I219 Acute myocardial infarction, unspecified: Secondary | ICD-10-CM | POA: Diagnosis not present

## 2017-09-30 DIAGNOSIS — D649 Anemia, unspecified: Secondary | ICD-10-CM | POA: Diagnosis not present

## 2017-10-26 DIAGNOSIS — D649 Anemia, unspecified: Secondary | ICD-10-CM | POA: Diagnosis not present

## 2017-10-26 DIAGNOSIS — S72002S Fracture of unspecified part of neck of left femur, sequela: Secondary | ICD-10-CM | POA: Diagnosis not present

## 2017-10-26 DIAGNOSIS — K922 Gastrointestinal hemorrhage, unspecified: Secondary | ICD-10-CM | POA: Diagnosis not present

## 2017-10-26 DIAGNOSIS — M6281 Muscle weakness (generalized): Secondary | ICD-10-CM | POA: Diagnosis not present

## 2017-10-26 DIAGNOSIS — K254 Chronic or unspecified gastric ulcer with hemorrhage: Secondary | ICD-10-CM | POA: Diagnosis not present

## 2017-10-26 DIAGNOSIS — E7800 Pure hypercholesterolemia, unspecified: Secondary | ICD-10-CM | POA: Diagnosis not present

## 2017-10-26 DIAGNOSIS — R2689 Other abnormalities of gait and mobility: Secondary | ICD-10-CM | POA: Diagnosis not present

## 2017-10-26 DIAGNOSIS — I2510 Atherosclerotic heart disease of native coronary artery without angina pectoris: Secondary | ICD-10-CM | POA: Diagnosis not present

## 2017-10-26 DIAGNOSIS — M1711 Unilateral primary osteoarthritis, right knee: Secondary | ICD-10-CM | POA: Diagnosis not present

## 2017-10-26 DIAGNOSIS — N183 Chronic kidney disease, stage 3 (moderate): Secondary | ICD-10-CM | POA: Diagnosis not present

## 2017-10-26 DIAGNOSIS — R262 Difficulty in walking, not elsewhere classified: Secondary | ICD-10-CM | POA: Diagnosis not present

## 2017-10-26 DIAGNOSIS — I219 Acute myocardial infarction, unspecified: Secondary | ICD-10-CM | POA: Diagnosis not present

## 2017-10-28 DIAGNOSIS — N183 Chronic kidney disease, stage 3 (moderate): Secondary | ICD-10-CM | POA: Diagnosis not present

## 2017-10-28 DIAGNOSIS — I2510 Atherosclerotic heart disease of native coronary artery without angina pectoris: Secondary | ICD-10-CM | POA: Diagnosis not present

## 2017-10-28 DIAGNOSIS — R262 Difficulty in walking, not elsewhere classified: Secondary | ICD-10-CM | POA: Diagnosis not present

## 2017-10-28 DIAGNOSIS — D649 Anemia, unspecified: Secondary | ICD-10-CM | POA: Diagnosis not present

## 2017-10-28 DIAGNOSIS — K922 Gastrointestinal hemorrhage, unspecified: Secondary | ICD-10-CM | POA: Diagnosis not present

## 2017-11-08 DIAGNOSIS — M1711 Unilateral primary osteoarthritis, right knee: Secondary | ICD-10-CM | POA: Diagnosis not present

## 2017-11-08 DIAGNOSIS — R42 Dizziness and giddiness: Secondary | ICD-10-CM | POA: Diagnosis not present

## 2017-11-08 DIAGNOSIS — R9431 Abnormal electrocardiogram [ECG] [EKG]: Secondary | ICD-10-CM | POA: Diagnosis not present

## 2017-11-08 DIAGNOSIS — T82855D Stenosis of coronary artery stent, subsequent encounter: Secondary | ICD-10-CM | POA: Diagnosis not present

## 2017-11-08 DIAGNOSIS — I2510 Atherosclerotic heart disease of native coronary artery without angina pectoris: Secondary | ICD-10-CM | POA: Diagnosis not present

## 2017-11-12 DIAGNOSIS — N183 Chronic kidney disease, stage 3 (moderate): Secondary | ICD-10-CM | POA: Diagnosis not present

## 2017-11-12 DIAGNOSIS — F209 Schizophrenia, unspecified: Secondary | ICD-10-CM | POA: Diagnosis not present

## 2017-11-12 DIAGNOSIS — I1310 Hypertensive heart and chronic kidney disease without heart failure, with stage 1 through stage 4 chronic kidney disease, or unspecified chronic kidney disease: Secondary | ICD-10-CM | POA: Diagnosis not present

## 2017-11-12 DIAGNOSIS — I2510 Atherosclerotic heart disease of native coronary artery without angina pectoris: Secondary | ICD-10-CM | POA: Diagnosis not present

## 2017-11-12 DIAGNOSIS — D631 Anemia in chronic kidney disease: Secondary | ICD-10-CM | POA: Diagnosis not present

## 2017-11-12 DIAGNOSIS — G9340 Encephalopathy, unspecified: Secondary | ICD-10-CM | POA: Diagnosis not present

## 2017-11-12 DIAGNOSIS — E7800 Pure hypercholesterolemia, unspecified: Secondary | ICD-10-CM | POA: Diagnosis not present

## 2017-11-12 DIAGNOSIS — F0391 Unspecified dementia with behavioral disturbance: Secondary | ICD-10-CM | POA: Diagnosis not present

## 2017-11-12 DIAGNOSIS — B373 Candidiasis of vulva and vagina: Secondary | ICD-10-CM | POA: Diagnosis not present

## 2017-11-12 DIAGNOSIS — N390 Urinary tract infection, site not specified: Secondary | ICD-10-CM | POA: Diagnosis not present

## 2017-11-14 DIAGNOSIS — Z0279 Encounter for issue of other medical certificate: Secondary | ICD-10-CM | POA: Diagnosis not present

## 2017-11-19 DIAGNOSIS — G9340 Encephalopathy, unspecified: Secondary | ICD-10-CM | POA: Diagnosis not present

## 2017-11-20 DIAGNOSIS — G9340 Encephalopathy, unspecified: Secondary | ICD-10-CM | POA: Diagnosis not present

## 2017-11-21 DIAGNOSIS — D631 Anemia in chronic kidney disease: Secondary | ICD-10-CM | POA: Diagnosis not present

## 2017-11-21 DIAGNOSIS — N183 Chronic kidney disease, stage 3 (moderate): Secondary | ICD-10-CM | POA: Diagnosis not present

## 2017-11-21 DIAGNOSIS — B373 Candidiasis of vulva and vagina: Secondary | ICD-10-CM | POA: Diagnosis not present

## 2017-11-21 DIAGNOSIS — G9340 Encephalopathy, unspecified: Secondary | ICD-10-CM | POA: Diagnosis not present

## 2017-11-21 DIAGNOSIS — F0391 Unspecified dementia with behavioral disturbance: Secondary | ICD-10-CM | POA: Diagnosis not present

## 2017-11-21 DIAGNOSIS — I2510 Atherosclerotic heart disease of native coronary artery without angina pectoris: Secondary | ICD-10-CM | POA: Diagnosis not present

## 2017-11-21 DIAGNOSIS — I1310 Hypertensive heart and chronic kidney disease without heart failure, with stage 1 through stage 4 chronic kidney disease, or unspecified chronic kidney disease: Secondary | ICD-10-CM | POA: Diagnosis not present

## 2017-11-22 DIAGNOSIS — I1310 Hypertensive heart and chronic kidney disease without heart failure, with stage 1 through stage 4 chronic kidney disease, or unspecified chronic kidney disease: Secondary | ICD-10-CM | POA: Diagnosis not present

## 2017-11-22 DIAGNOSIS — B373 Candidiasis of vulva and vagina: Secondary | ICD-10-CM | POA: Diagnosis not present

## 2017-11-22 DIAGNOSIS — N183 Chronic kidney disease, stage 3 (moderate): Secondary | ICD-10-CM | POA: Diagnosis not present

## 2017-11-22 DIAGNOSIS — F0391 Unspecified dementia with behavioral disturbance: Secondary | ICD-10-CM | POA: Diagnosis not present

## 2017-11-22 DIAGNOSIS — I2510 Atherosclerotic heart disease of native coronary artery without angina pectoris: Secondary | ICD-10-CM | POA: Diagnosis not present

## 2017-11-22 DIAGNOSIS — D631 Anemia in chronic kidney disease: Secondary | ICD-10-CM | POA: Diagnosis not present

## 2017-11-22 DIAGNOSIS — G9340 Encephalopathy, unspecified: Secondary | ICD-10-CM | POA: Diagnosis not present

## 2017-11-23 DIAGNOSIS — F0391 Unspecified dementia with behavioral disturbance: Secondary | ICD-10-CM | POA: Diagnosis not present

## 2017-11-23 DIAGNOSIS — I1310 Hypertensive heart and chronic kidney disease without heart failure, with stage 1 through stage 4 chronic kidney disease, or unspecified chronic kidney disease: Secondary | ICD-10-CM | POA: Diagnosis not present

## 2017-11-23 DIAGNOSIS — N183 Chronic kidney disease, stage 3 (moderate): Secondary | ICD-10-CM | POA: Diagnosis not present

## 2017-11-23 DIAGNOSIS — G9340 Encephalopathy, unspecified: Secondary | ICD-10-CM | POA: Diagnosis not present

## 2017-11-23 DIAGNOSIS — D631 Anemia in chronic kidney disease: Secondary | ICD-10-CM | POA: Diagnosis not present

## 2017-11-23 DIAGNOSIS — I2510 Atherosclerotic heart disease of native coronary artery without angina pectoris: Secondary | ICD-10-CM | POA: Diagnosis not present

## 2017-11-23 DIAGNOSIS — B373 Candidiasis of vulva and vagina: Secondary | ICD-10-CM | POA: Diagnosis not present

## 2017-11-24 DIAGNOSIS — N183 Chronic kidney disease, stage 3 (moderate): Secondary | ICD-10-CM | POA: Diagnosis not present

## 2017-11-24 DIAGNOSIS — F0391 Unspecified dementia with behavioral disturbance: Secondary | ICD-10-CM | POA: Diagnosis not present

## 2017-11-24 DIAGNOSIS — I1310 Hypertensive heart and chronic kidney disease without heart failure, with stage 1 through stage 4 chronic kidney disease, or unspecified chronic kidney disease: Secondary | ICD-10-CM | POA: Diagnosis not present

## 2017-11-24 DIAGNOSIS — G9340 Encephalopathy, unspecified: Secondary | ICD-10-CM | POA: Diagnosis not present

## 2017-11-24 DIAGNOSIS — I2510 Atherosclerotic heart disease of native coronary artery without angina pectoris: Secondary | ICD-10-CM | POA: Diagnosis not present

## 2017-11-24 DIAGNOSIS — B373 Candidiasis of vulva and vagina: Secondary | ICD-10-CM | POA: Diagnosis not present

## 2017-11-24 DIAGNOSIS — D631 Anemia in chronic kidney disease: Secondary | ICD-10-CM | POA: Diagnosis not present

## 2017-11-25 DIAGNOSIS — I1310 Hypertensive heart and chronic kidney disease without heart failure, with stage 1 through stage 4 chronic kidney disease, or unspecified chronic kidney disease: Secondary | ICD-10-CM | POA: Diagnosis not present

## 2017-11-25 DIAGNOSIS — G9340 Encephalopathy, unspecified: Secondary | ICD-10-CM | POA: Diagnosis not present

## 2017-11-25 DIAGNOSIS — N183 Chronic kidney disease, stage 3 (moderate): Secondary | ICD-10-CM | POA: Diagnosis not present

## 2017-11-25 DIAGNOSIS — D631 Anemia in chronic kidney disease: Secondary | ICD-10-CM | POA: Diagnosis not present

## 2017-11-25 DIAGNOSIS — B373 Candidiasis of vulva and vagina: Secondary | ICD-10-CM | POA: Diagnosis not present

## 2017-11-25 DIAGNOSIS — F0391 Unspecified dementia with behavioral disturbance: Secondary | ICD-10-CM | POA: Diagnosis not present

## 2017-11-25 DIAGNOSIS — I2510 Atherosclerotic heart disease of native coronary artery without angina pectoris: Secondary | ICD-10-CM | POA: Diagnosis not present

## 2017-11-26 DIAGNOSIS — I2510 Atherosclerotic heart disease of native coronary artery without angina pectoris: Secondary | ICD-10-CM | POA: Diagnosis not present

## 2017-11-26 DIAGNOSIS — F0391 Unspecified dementia with behavioral disturbance: Secondary | ICD-10-CM | POA: Diagnosis not present

## 2017-11-26 DIAGNOSIS — I1310 Hypertensive heart and chronic kidney disease without heart failure, with stage 1 through stage 4 chronic kidney disease, or unspecified chronic kidney disease: Secondary | ICD-10-CM | POA: Diagnosis not present

## 2017-11-26 DIAGNOSIS — B373 Candidiasis of vulva and vagina: Secondary | ICD-10-CM | POA: Diagnosis not present

## 2017-11-26 DIAGNOSIS — G9340 Encephalopathy, unspecified: Secondary | ICD-10-CM | POA: Diagnosis not present

## 2017-11-26 DIAGNOSIS — N183 Chronic kidney disease, stage 3 (moderate): Secondary | ICD-10-CM | POA: Diagnosis not present

## 2017-11-26 DIAGNOSIS — D631 Anemia in chronic kidney disease: Secondary | ICD-10-CM | POA: Diagnosis not present

## 2017-11-27 DIAGNOSIS — I1310 Hypertensive heart and chronic kidney disease without heart failure, with stage 1 through stage 4 chronic kidney disease, or unspecified chronic kidney disease: Secondary | ICD-10-CM | POA: Diagnosis not present

## 2017-11-27 DIAGNOSIS — F0391 Unspecified dementia with behavioral disturbance: Secondary | ICD-10-CM | POA: Diagnosis not present

## 2017-11-27 DIAGNOSIS — I2510 Atherosclerotic heart disease of native coronary artery without angina pectoris: Secondary | ICD-10-CM | POA: Diagnosis not present

## 2017-11-27 DIAGNOSIS — D631 Anemia in chronic kidney disease: Secondary | ICD-10-CM | POA: Diagnosis not present

## 2017-11-27 DIAGNOSIS — G9340 Encephalopathy, unspecified: Secondary | ICD-10-CM | POA: Diagnosis not present

## 2017-11-27 DIAGNOSIS — N183 Chronic kidney disease, stage 3 (moderate): Secondary | ICD-10-CM | POA: Diagnosis not present

## 2017-11-27 DIAGNOSIS — B373 Candidiasis of vulva and vagina: Secondary | ICD-10-CM | POA: Diagnosis not present

## 2017-12-04 DIAGNOSIS — I119 Hypertensive heart disease without heart failure: Secondary | ICD-10-CM | POA: Diagnosis not present

## 2017-12-04 DIAGNOSIS — N183 Chronic kidney disease, stage 3 (moderate): Secondary | ICD-10-CM | POA: Diagnosis not present

## 2017-12-04 DIAGNOSIS — I2510 Atherosclerotic heart disease of native coronary artery without angina pectoris: Secondary | ICD-10-CM | POA: Diagnosis not present

## 2017-12-04 DIAGNOSIS — E7800 Pure hypercholesterolemia, unspecified: Secondary | ICD-10-CM | POA: Diagnosis not present

## 2017-12-04 DIAGNOSIS — D638 Anemia in other chronic diseases classified elsewhere: Secondary | ICD-10-CM | POA: Diagnosis not present

## 2017-12-05 DIAGNOSIS — M6281 Muscle weakness (generalized): Secondary | ICD-10-CM | POA: Diagnosis not present

## 2017-12-05 DIAGNOSIS — I219 Acute myocardial infarction, unspecified: Secondary | ICD-10-CM | POA: Diagnosis not present

## 2017-12-05 DIAGNOSIS — S72002S Fracture of unspecified part of neck of left femur, sequela: Secondary | ICD-10-CM | POA: Diagnosis not present

## 2017-12-05 DIAGNOSIS — R2689 Other abnormalities of gait and mobility: Secondary | ICD-10-CM | POA: Diagnosis not present

## 2017-12-05 DIAGNOSIS — M1711 Unilateral primary osteoarthritis, right knee: Secondary | ICD-10-CM | POA: Diagnosis not present

## 2017-12-07 DIAGNOSIS — S22080A Wedge compression fracture of T11-T12 vertebra, initial encounter for closed fracture: Secondary | ICD-10-CM | POA: Diagnosis not present

## 2017-12-07 DIAGNOSIS — S20212A Contusion of left front wall of thorax, initial encounter: Secondary | ICD-10-CM | POA: Diagnosis not present

## 2017-12-07 DIAGNOSIS — F0390 Unspecified dementia without behavioral disturbance: Secondary | ICD-10-CM | POA: Diagnosis not present

## 2017-12-07 DIAGNOSIS — M4854XA Collapsed vertebra, not elsewhere classified, thoracic region, initial encounter for fracture: Secondary | ICD-10-CM | POA: Diagnosis not present

## 2017-12-07 DIAGNOSIS — N183 Chronic kidney disease, stage 3 (moderate): Secondary | ICD-10-CM | POA: Diagnosis not present

## 2017-12-07 DIAGNOSIS — R109 Unspecified abdominal pain: Secondary | ICD-10-CM | POA: Diagnosis not present

## 2017-12-07 DIAGNOSIS — R748 Abnormal levels of other serum enzymes: Secondary | ICD-10-CM | POA: Diagnosis not present

## 2017-12-07 DIAGNOSIS — J432 Centrilobular emphysema: Secondary | ICD-10-CM | POA: Diagnosis not present

## 2017-12-07 DIAGNOSIS — I129 Hypertensive chronic kidney disease with stage 1 through stage 4 chronic kidney disease, or unspecified chronic kidney disease: Secondary | ICD-10-CM | POA: Diagnosis not present

## 2017-12-07 DIAGNOSIS — K449 Diaphragmatic hernia without obstruction or gangrene: Secondary | ICD-10-CM | POA: Diagnosis not present

## 2017-12-07 DIAGNOSIS — E039 Hypothyroidism, unspecified: Secondary | ICD-10-CM | POA: Diagnosis not present

## 2017-12-07 DIAGNOSIS — J9811 Atelectasis: Secondary | ICD-10-CM | POA: Diagnosis not present

## 2017-12-07 DIAGNOSIS — I2510 Atherosclerotic heart disease of native coronary artery without angina pectoris: Secondary | ICD-10-CM | POA: Diagnosis not present

## 2017-12-07 DIAGNOSIS — S298XXA Other specified injuries of thorax, initial encounter: Secondary | ICD-10-CM | POA: Diagnosis not present

## 2017-12-07 DIAGNOSIS — E7800 Pure hypercholesterolemia, unspecified: Secondary | ICD-10-CM | POA: Diagnosis not present

## 2017-12-07 DIAGNOSIS — R29898 Other symptoms and signs involving the musculoskeletal system: Secondary | ICD-10-CM | POA: Diagnosis not present

## 2017-12-07 DIAGNOSIS — S2020XA Contusion of thorax, unspecified, initial encounter: Secondary | ICD-10-CM | POA: Diagnosis not present

## 2017-12-07 DIAGNOSIS — I517 Cardiomegaly: Secondary | ICD-10-CM | POA: Diagnosis not present

## 2017-12-07 DIAGNOSIS — Z0001 Encounter for general adult medical examination with abnormal findings: Secondary | ICD-10-CM | POA: Diagnosis not present

## 2017-12-07 DIAGNOSIS — E119 Type 2 diabetes mellitus without complications: Secondary | ICD-10-CM | POA: Diagnosis not present

## 2017-12-07 DIAGNOSIS — Z043 Encounter for examination and observation following other accident: Secondary | ICD-10-CM | POA: Diagnosis not present

## 2017-12-07 DIAGNOSIS — R7989 Other specified abnormal findings of blood chemistry: Secondary | ICD-10-CM | POA: Diagnosis not present

## 2017-12-07 DIAGNOSIS — D649 Anemia, unspecified: Secondary | ICD-10-CM | POA: Diagnosis not present

## 2017-12-07 DIAGNOSIS — G9389 Other specified disorders of brain: Secondary | ICD-10-CM | POA: Diagnosis not present

## 2017-12-07 DIAGNOSIS — D631 Anemia in chronic kidney disease: Secondary | ICD-10-CM | POA: Diagnosis not present

## 2017-12-07 DIAGNOSIS — I10 Essential (primary) hypertension: Secondary | ICD-10-CM | POA: Diagnosis not present

## 2017-12-07 DIAGNOSIS — E785 Hyperlipidemia, unspecified: Secondary | ICD-10-CM | POA: Diagnosis not present

## 2017-12-07 DIAGNOSIS — F0391 Unspecified dementia with behavioral disturbance: Secondary | ICD-10-CM | POA: Diagnosis not present

## 2017-12-08 DIAGNOSIS — S22080A Wedge compression fracture of T11-T12 vertebra, initial encounter for closed fracture: Secondary | ICD-10-CM | POA: Diagnosis not present

## 2017-12-09 DIAGNOSIS — M4854XA Collapsed vertebra, not elsewhere classified, thoracic region, initial encounter for fracture: Secondary | ICD-10-CM | POA: Diagnosis not present

## 2017-12-09 DIAGNOSIS — R7989 Other specified abnormal findings of blood chemistry: Secondary | ICD-10-CM | POA: Diagnosis not present

## 2017-12-09 DIAGNOSIS — D631 Anemia in chronic kidney disease: Secondary | ICD-10-CM | POA: Diagnosis not present

## 2017-12-09 DIAGNOSIS — S2020XA Contusion of thorax, unspecified, initial encounter: Secondary | ICD-10-CM | POA: Diagnosis not present

## 2017-12-10 DIAGNOSIS — R7989 Other specified abnormal findings of blood chemistry: Secondary | ICD-10-CM | POA: Diagnosis not present

## 2017-12-10 DIAGNOSIS — M4854XA Collapsed vertebra, not elsewhere classified, thoracic region, initial encounter for fracture: Secondary | ICD-10-CM | POA: Diagnosis not present

## 2017-12-10 DIAGNOSIS — S2020XA Contusion of thorax, unspecified, initial encounter: Secondary | ICD-10-CM | POA: Diagnosis not present

## 2017-12-10 DIAGNOSIS — D631 Anemia in chronic kidney disease: Secondary | ICD-10-CM | POA: Diagnosis not present

## 2017-12-11 DIAGNOSIS — M4854XA Collapsed vertebra, not elsewhere classified, thoracic region, initial encounter for fracture: Secondary | ICD-10-CM | POA: Diagnosis not present

## 2017-12-11 DIAGNOSIS — R7989 Other specified abnormal findings of blood chemistry: Secondary | ICD-10-CM | POA: Diagnosis not present

## 2017-12-11 DIAGNOSIS — D631 Anemia in chronic kidney disease: Secondary | ICD-10-CM | POA: Diagnosis not present

## 2017-12-11 DIAGNOSIS — S2020XA Contusion of thorax, unspecified, initial encounter: Secondary | ICD-10-CM | POA: Diagnosis not present

## 2017-12-15 DIAGNOSIS — N183 Chronic kidney disease, stage 3 (moderate): Secondary | ICD-10-CM | POA: Diagnosis not present

## 2017-12-15 DIAGNOSIS — E785 Hyperlipidemia, unspecified: Secondary | ICD-10-CM | POA: Diagnosis not present

## 2017-12-15 DIAGNOSIS — Z7982 Long term (current) use of aspirin: Secondary | ICD-10-CM | POA: Diagnosis not present

## 2017-12-15 DIAGNOSIS — I129 Hypertensive chronic kidney disease with stage 1 through stage 4 chronic kidney disease, or unspecified chronic kidney disease: Secondary | ICD-10-CM | POA: Diagnosis not present

## 2017-12-15 DIAGNOSIS — I25118 Atherosclerotic heart disease of native coronary artery with other forms of angina pectoris: Secondary | ICD-10-CM | POA: Diagnosis not present

## 2017-12-15 DIAGNOSIS — R262 Difficulty in walking, not elsewhere classified: Secondary | ICD-10-CM | POA: Diagnosis not present

## 2017-12-15 DIAGNOSIS — F0391 Unspecified dementia with behavioral disturbance: Secondary | ICD-10-CM | POA: Diagnosis not present

## 2017-12-18 DIAGNOSIS — S22080G Wedge compression fracture of T11-T12 vertebra, subsequent encounter for fracture with delayed healing: Secondary | ICD-10-CM | POA: Diagnosis not present

## 2017-12-18 DIAGNOSIS — N184 Chronic kidney disease, stage 4 (severe): Secondary | ICD-10-CM | POA: Diagnosis not present

## 2017-12-18 DIAGNOSIS — W1830XA Fall on same level, unspecified, initial encounter: Secondary | ICD-10-CM | POA: Diagnosis not present

## 2017-12-18 DIAGNOSIS — D509 Iron deficiency anemia, unspecified: Secondary | ICD-10-CM | POA: Diagnosis not present

## 2017-12-19 DIAGNOSIS — R262 Difficulty in walking, not elsewhere classified: Secondary | ICD-10-CM | POA: Diagnosis not present

## 2017-12-19 DIAGNOSIS — I25118 Atherosclerotic heart disease of native coronary artery with other forms of angina pectoris: Secondary | ICD-10-CM | POA: Diagnosis not present

## 2017-12-19 DIAGNOSIS — I129 Hypertensive chronic kidney disease with stage 1 through stage 4 chronic kidney disease, or unspecified chronic kidney disease: Secondary | ICD-10-CM | POA: Diagnosis not present

## 2017-12-19 DIAGNOSIS — Z7982 Long term (current) use of aspirin: Secondary | ICD-10-CM | POA: Diagnosis not present

## 2017-12-19 DIAGNOSIS — N183 Chronic kidney disease, stage 3 (moderate): Secondary | ICD-10-CM | POA: Diagnosis not present

## 2017-12-19 DIAGNOSIS — E785 Hyperlipidemia, unspecified: Secondary | ICD-10-CM | POA: Diagnosis not present

## 2017-12-19 DIAGNOSIS — F0391 Unspecified dementia with behavioral disturbance: Secondary | ICD-10-CM | POA: Diagnosis not present

## 2017-12-21 DIAGNOSIS — Z7982 Long term (current) use of aspirin: Secondary | ICD-10-CM | POA: Diagnosis not present

## 2017-12-21 DIAGNOSIS — I129 Hypertensive chronic kidney disease with stage 1 through stage 4 chronic kidney disease, or unspecified chronic kidney disease: Secondary | ICD-10-CM | POA: Diagnosis not present

## 2017-12-21 DIAGNOSIS — E785 Hyperlipidemia, unspecified: Secondary | ICD-10-CM | POA: Diagnosis not present

## 2017-12-21 DIAGNOSIS — I25118 Atherosclerotic heart disease of native coronary artery with other forms of angina pectoris: Secondary | ICD-10-CM | POA: Diagnosis not present

## 2017-12-21 DIAGNOSIS — R262 Difficulty in walking, not elsewhere classified: Secondary | ICD-10-CM | POA: Diagnosis not present

## 2017-12-21 DIAGNOSIS — F0391 Unspecified dementia with behavioral disturbance: Secondary | ICD-10-CM | POA: Diagnosis not present

## 2017-12-21 DIAGNOSIS — N183 Chronic kidney disease, stage 3 (moderate): Secondary | ICD-10-CM | POA: Diagnosis not present

## 2017-12-25 DIAGNOSIS — E785 Hyperlipidemia, unspecified: Secondary | ICD-10-CM | POA: Diagnosis not present

## 2017-12-25 DIAGNOSIS — Z7982 Long term (current) use of aspirin: Secondary | ICD-10-CM | POA: Diagnosis not present

## 2017-12-25 DIAGNOSIS — I25118 Atherosclerotic heart disease of native coronary artery with other forms of angina pectoris: Secondary | ICD-10-CM | POA: Diagnosis not present

## 2017-12-25 DIAGNOSIS — I129 Hypertensive chronic kidney disease with stage 1 through stage 4 chronic kidney disease, or unspecified chronic kidney disease: Secondary | ICD-10-CM | POA: Diagnosis not present

## 2017-12-25 DIAGNOSIS — F0391 Unspecified dementia with behavioral disturbance: Secondary | ICD-10-CM | POA: Diagnosis not present

## 2017-12-25 DIAGNOSIS — N183 Chronic kidney disease, stage 3 (moderate): Secondary | ICD-10-CM | POA: Diagnosis not present

## 2017-12-25 DIAGNOSIS — R262 Difficulty in walking, not elsewhere classified: Secondary | ICD-10-CM | POA: Diagnosis not present

## 2017-12-27 DIAGNOSIS — I129 Hypertensive chronic kidney disease with stage 1 through stage 4 chronic kidney disease, or unspecified chronic kidney disease: Secondary | ICD-10-CM | POA: Diagnosis not present

## 2017-12-27 DIAGNOSIS — N183 Chronic kidney disease, stage 3 (moderate): Secondary | ICD-10-CM | POA: Diagnosis not present

## 2017-12-27 DIAGNOSIS — F0391 Unspecified dementia with behavioral disturbance: Secondary | ICD-10-CM | POA: Diagnosis not present

## 2017-12-27 DIAGNOSIS — R262 Difficulty in walking, not elsewhere classified: Secondary | ICD-10-CM | POA: Diagnosis not present

## 2017-12-27 DIAGNOSIS — E785 Hyperlipidemia, unspecified: Secondary | ICD-10-CM | POA: Diagnosis not present

## 2017-12-27 DIAGNOSIS — Z7982 Long term (current) use of aspirin: Secondary | ICD-10-CM | POA: Diagnosis not present

## 2017-12-27 DIAGNOSIS — I25118 Atherosclerotic heart disease of native coronary artery with other forms of angina pectoris: Secondary | ICD-10-CM | POA: Diagnosis not present

## 2018-01-01 DIAGNOSIS — N183 Chronic kidney disease, stage 3 (moderate): Secondary | ICD-10-CM | POA: Diagnosis not present

## 2018-01-01 DIAGNOSIS — Z7982 Long term (current) use of aspirin: Secondary | ICD-10-CM | POA: Diagnosis not present

## 2018-01-01 DIAGNOSIS — R262 Difficulty in walking, not elsewhere classified: Secondary | ICD-10-CM | POA: Diagnosis not present

## 2018-01-01 DIAGNOSIS — I129 Hypertensive chronic kidney disease with stage 1 through stage 4 chronic kidney disease, or unspecified chronic kidney disease: Secondary | ICD-10-CM | POA: Diagnosis not present

## 2018-01-01 DIAGNOSIS — E785 Hyperlipidemia, unspecified: Secondary | ICD-10-CM | POA: Diagnosis not present

## 2018-01-01 DIAGNOSIS — I25118 Atherosclerotic heart disease of native coronary artery with other forms of angina pectoris: Secondary | ICD-10-CM | POA: Diagnosis not present

## 2018-01-01 DIAGNOSIS — F0391 Unspecified dementia with behavioral disturbance: Secondary | ICD-10-CM | POA: Diagnosis not present

## 2018-01-03 DIAGNOSIS — F0391 Unspecified dementia with behavioral disturbance: Secondary | ICD-10-CM | POA: Diagnosis not present

## 2018-01-03 DIAGNOSIS — I129 Hypertensive chronic kidney disease with stage 1 through stage 4 chronic kidney disease, or unspecified chronic kidney disease: Secondary | ICD-10-CM | POA: Diagnosis not present

## 2018-01-03 DIAGNOSIS — N183 Chronic kidney disease, stage 3 (moderate): Secondary | ICD-10-CM | POA: Diagnosis not present

## 2018-01-03 DIAGNOSIS — Z7982 Long term (current) use of aspirin: Secondary | ICD-10-CM | POA: Diagnosis not present

## 2018-01-03 DIAGNOSIS — R262 Difficulty in walking, not elsewhere classified: Secondary | ICD-10-CM | POA: Diagnosis not present

## 2018-01-03 DIAGNOSIS — I25118 Atherosclerotic heart disease of native coronary artery with other forms of angina pectoris: Secondary | ICD-10-CM | POA: Diagnosis not present

## 2018-01-03 DIAGNOSIS — E785 Hyperlipidemia, unspecified: Secondary | ICD-10-CM | POA: Diagnosis not present

## 2018-01-05 DIAGNOSIS — M1711 Unilateral primary osteoarthritis, right knee: Secondary | ICD-10-CM | POA: Diagnosis not present

## 2018-01-05 DIAGNOSIS — M6281 Muscle weakness (generalized): Secondary | ICD-10-CM | POA: Diagnosis not present

## 2018-01-05 DIAGNOSIS — I219 Acute myocardial infarction, unspecified: Secondary | ICD-10-CM | POA: Diagnosis not present

## 2018-01-05 DIAGNOSIS — S72002S Fracture of unspecified part of neck of left femur, sequela: Secondary | ICD-10-CM | POA: Diagnosis not present

## 2018-01-05 DIAGNOSIS — R2689 Other abnormalities of gait and mobility: Secondary | ICD-10-CM | POA: Diagnosis not present

## 2018-01-09 DIAGNOSIS — F329 Major depressive disorder, single episode, unspecified: Secondary | ICD-10-CM | POA: Diagnosis not present

## 2018-01-09 DIAGNOSIS — N183 Chronic kidney disease, stage 3 (moderate): Secondary | ICD-10-CM | POA: Diagnosis not present

## 2018-01-09 DIAGNOSIS — I129 Hypertensive chronic kidney disease with stage 1 through stage 4 chronic kidney disease, or unspecified chronic kidney disease: Secondary | ICD-10-CM | POA: Diagnosis not present

## 2018-01-09 DIAGNOSIS — F5102 Adjustment insomnia: Secondary | ICD-10-CM | POA: Diagnosis not present

## 2018-01-09 DIAGNOSIS — R262 Difficulty in walking, not elsewhere classified: Secondary | ICD-10-CM | POA: Diagnosis not present

## 2018-01-09 DIAGNOSIS — I25118 Atherosclerotic heart disease of native coronary artery with other forms of angina pectoris: Secondary | ICD-10-CM | POA: Diagnosis not present

## 2018-01-09 DIAGNOSIS — Z7982 Long term (current) use of aspirin: Secondary | ICD-10-CM | POA: Diagnosis not present

## 2018-01-09 DIAGNOSIS — E785 Hyperlipidemia, unspecified: Secondary | ICD-10-CM | POA: Diagnosis not present

## 2018-01-09 DIAGNOSIS — F0391 Unspecified dementia with behavioral disturbance: Secondary | ICD-10-CM | POA: Diagnosis not present

## 2018-01-15 DIAGNOSIS — F0391 Unspecified dementia with behavioral disturbance: Secondary | ICD-10-CM | POA: Diagnosis not present

## 2018-01-15 DIAGNOSIS — E785 Hyperlipidemia, unspecified: Secondary | ICD-10-CM | POA: Diagnosis not present

## 2018-01-15 DIAGNOSIS — R262 Difficulty in walking, not elsewhere classified: Secondary | ICD-10-CM | POA: Diagnosis not present

## 2018-01-15 DIAGNOSIS — I25118 Atherosclerotic heart disease of native coronary artery with other forms of angina pectoris: Secondary | ICD-10-CM | POA: Diagnosis not present

## 2018-01-15 DIAGNOSIS — N183 Chronic kidney disease, stage 3 (moderate): Secondary | ICD-10-CM | POA: Diagnosis not present

## 2018-01-15 DIAGNOSIS — I129 Hypertensive chronic kidney disease with stage 1 through stage 4 chronic kidney disease, or unspecified chronic kidney disease: Secondary | ICD-10-CM | POA: Diagnosis not present

## 2018-01-15 DIAGNOSIS — Z7982 Long term (current) use of aspirin: Secondary | ICD-10-CM | POA: Diagnosis not present

## 2018-01-18 DIAGNOSIS — R262 Difficulty in walking, not elsewhere classified: Secondary | ICD-10-CM | POA: Diagnosis not present

## 2018-01-18 DIAGNOSIS — E785 Hyperlipidemia, unspecified: Secondary | ICD-10-CM | POA: Diagnosis not present

## 2018-01-18 DIAGNOSIS — N183 Chronic kidney disease, stage 3 (moderate): Secondary | ICD-10-CM | POA: Diagnosis not present

## 2018-01-18 DIAGNOSIS — I25118 Atherosclerotic heart disease of native coronary artery with other forms of angina pectoris: Secondary | ICD-10-CM | POA: Diagnosis not present

## 2018-01-18 DIAGNOSIS — I129 Hypertensive chronic kidney disease with stage 1 through stage 4 chronic kidney disease, or unspecified chronic kidney disease: Secondary | ICD-10-CM | POA: Diagnosis not present

## 2018-01-18 DIAGNOSIS — F0391 Unspecified dementia with behavioral disturbance: Secondary | ICD-10-CM | POA: Diagnosis not present

## 2018-01-18 DIAGNOSIS — Z7982 Long term (current) use of aspirin: Secondary | ICD-10-CM | POA: Diagnosis not present

## 2018-02-05 DIAGNOSIS — M6281 Muscle weakness (generalized): Secondary | ICD-10-CM | POA: Diagnosis not present

## 2018-02-05 DIAGNOSIS — M1711 Unilateral primary osteoarthritis, right knee: Secondary | ICD-10-CM | POA: Diagnosis not present

## 2018-02-05 DIAGNOSIS — R2689 Other abnormalities of gait and mobility: Secondary | ICD-10-CM | POA: Diagnosis not present

## 2018-02-05 DIAGNOSIS — S72002S Fracture of unspecified part of neck of left femur, sequela: Secondary | ICD-10-CM | POA: Diagnosis not present

## 2018-02-05 DIAGNOSIS — I219 Acute myocardial infarction, unspecified: Secondary | ICD-10-CM | POA: Diagnosis not present

## 2018-02-06 DIAGNOSIS — F329 Major depressive disorder, single episode, unspecified: Secondary | ICD-10-CM | POA: Diagnosis not present

## 2018-02-06 DIAGNOSIS — F0391 Unspecified dementia with behavioral disturbance: Secondary | ICD-10-CM | POA: Diagnosis not present

## 2018-02-06 DIAGNOSIS — F5102 Adjustment insomnia: Secondary | ICD-10-CM | POA: Diagnosis not present

## 2018-02-27 DIAGNOSIS — I2510 Atherosclerotic heart disease of native coronary artery without angina pectoris: Secondary | ICD-10-CM | POA: Diagnosis not present

## 2018-03-05 DIAGNOSIS — D509 Iron deficiency anemia, unspecified: Secondary | ICD-10-CM | POA: Diagnosis not present

## 2018-03-05 DIAGNOSIS — N184 Chronic kidney disease, stage 4 (severe): Secondary | ICD-10-CM | POA: Diagnosis not present

## 2018-03-05 DIAGNOSIS — I119 Hypertensive heart disease without heart failure: Secondary | ICD-10-CM | POA: Diagnosis not present

## 2018-03-05 DIAGNOSIS — E7800 Pure hypercholesterolemia, unspecified: Secondary | ICD-10-CM | POA: Diagnosis not present

## 2018-03-06 DIAGNOSIS — F329 Major depressive disorder, single episode, unspecified: Secondary | ICD-10-CM | POA: Diagnosis not present

## 2018-03-06 DIAGNOSIS — F0391 Unspecified dementia with behavioral disturbance: Secondary | ICD-10-CM | POA: Diagnosis not present

## 2018-03-06 DIAGNOSIS — F5102 Adjustment insomnia: Secondary | ICD-10-CM | POA: Diagnosis not present

## 2018-03-07 DIAGNOSIS — R2689 Other abnormalities of gait and mobility: Secondary | ICD-10-CM | POA: Diagnosis not present

## 2018-03-07 DIAGNOSIS — M6281 Muscle weakness (generalized): Secondary | ICD-10-CM | POA: Diagnosis not present

## 2018-03-07 DIAGNOSIS — M1711 Unilateral primary osteoarthritis, right knee: Secondary | ICD-10-CM | POA: Diagnosis not present

## 2018-03-07 DIAGNOSIS — F329 Major depressive disorder, single episode, unspecified: Secondary | ICD-10-CM | POA: Diagnosis not present

## 2018-03-07 DIAGNOSIS — S72002S Fracture of unspecified part of neck of left femur, sequela: Secondary | ICD-10-CM | POA: Diagnosis not present

## 2018-03-07 DIAGNOSIS — I219 Acute myocardial infarction, unspecified: Secondary | ICD-10-CM | POA: Diagnosis not present

## 2018-03-15 DIAGNOSIS — N184 Chronic kidney disease, stage 4 (severe): Secondary | ICD-10-CM | POA: Diagnosis not present

## 2018-03-15 DIAGNOSIS — D649 Anemia, unspecified: Secondary | ICD-10-CM | POA: Diagnosis not present

## 2018-03-15 DIAGNOSIS — D509 Iron deficiency anemia, unspecified: Secondary | ICD-10-CM | POA: Diagnosis not present

## 2020-02-29 IMAGING — CR DG CHEST 2V
2 series · 2 of 2 positions shown · non-contrast
Comparison: June 26, 2016

CLINICAL DATA: Lower extremity edema.  Hypertension.

EXAM:
CHEST - 2 VIEW

[w chest pa]
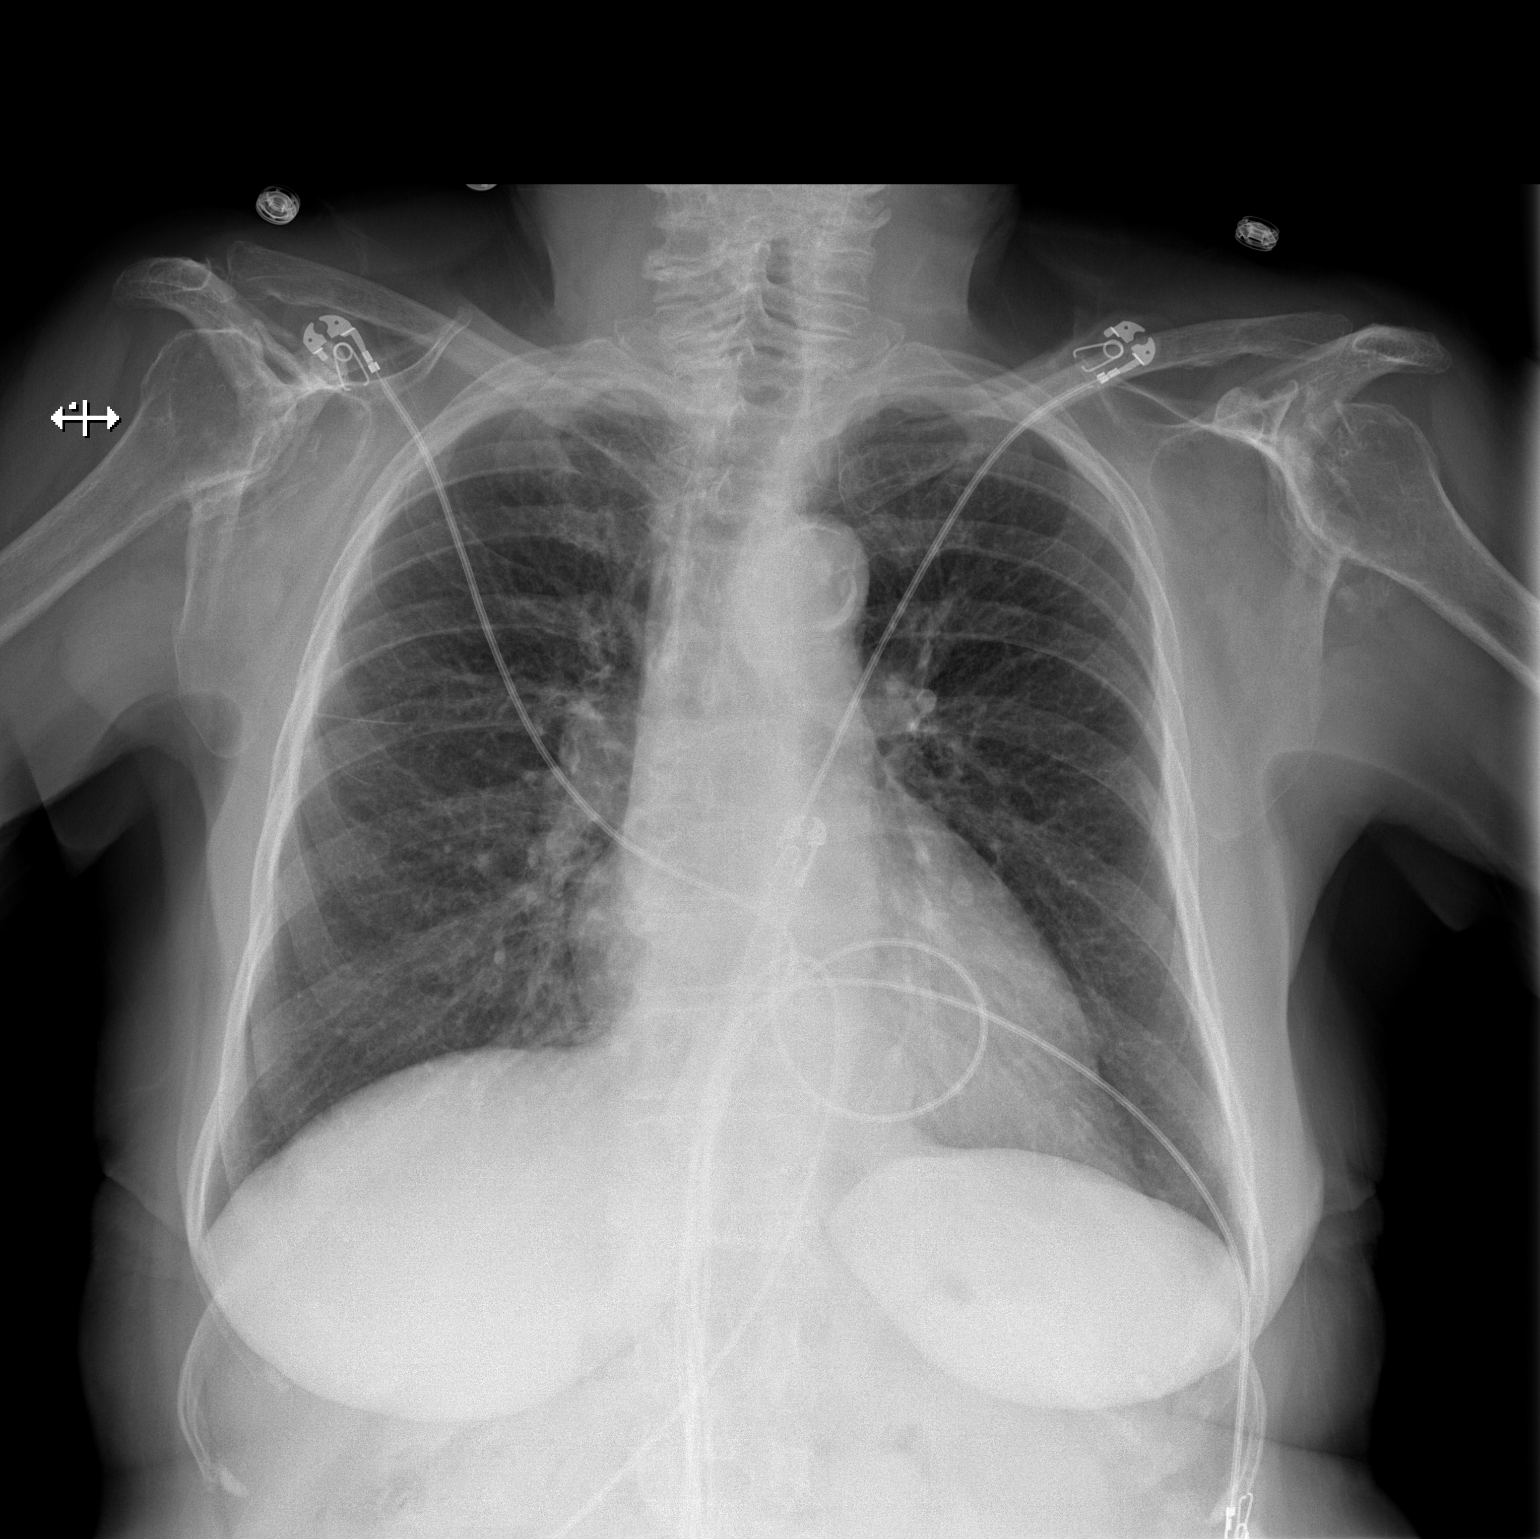

[w chest lat]
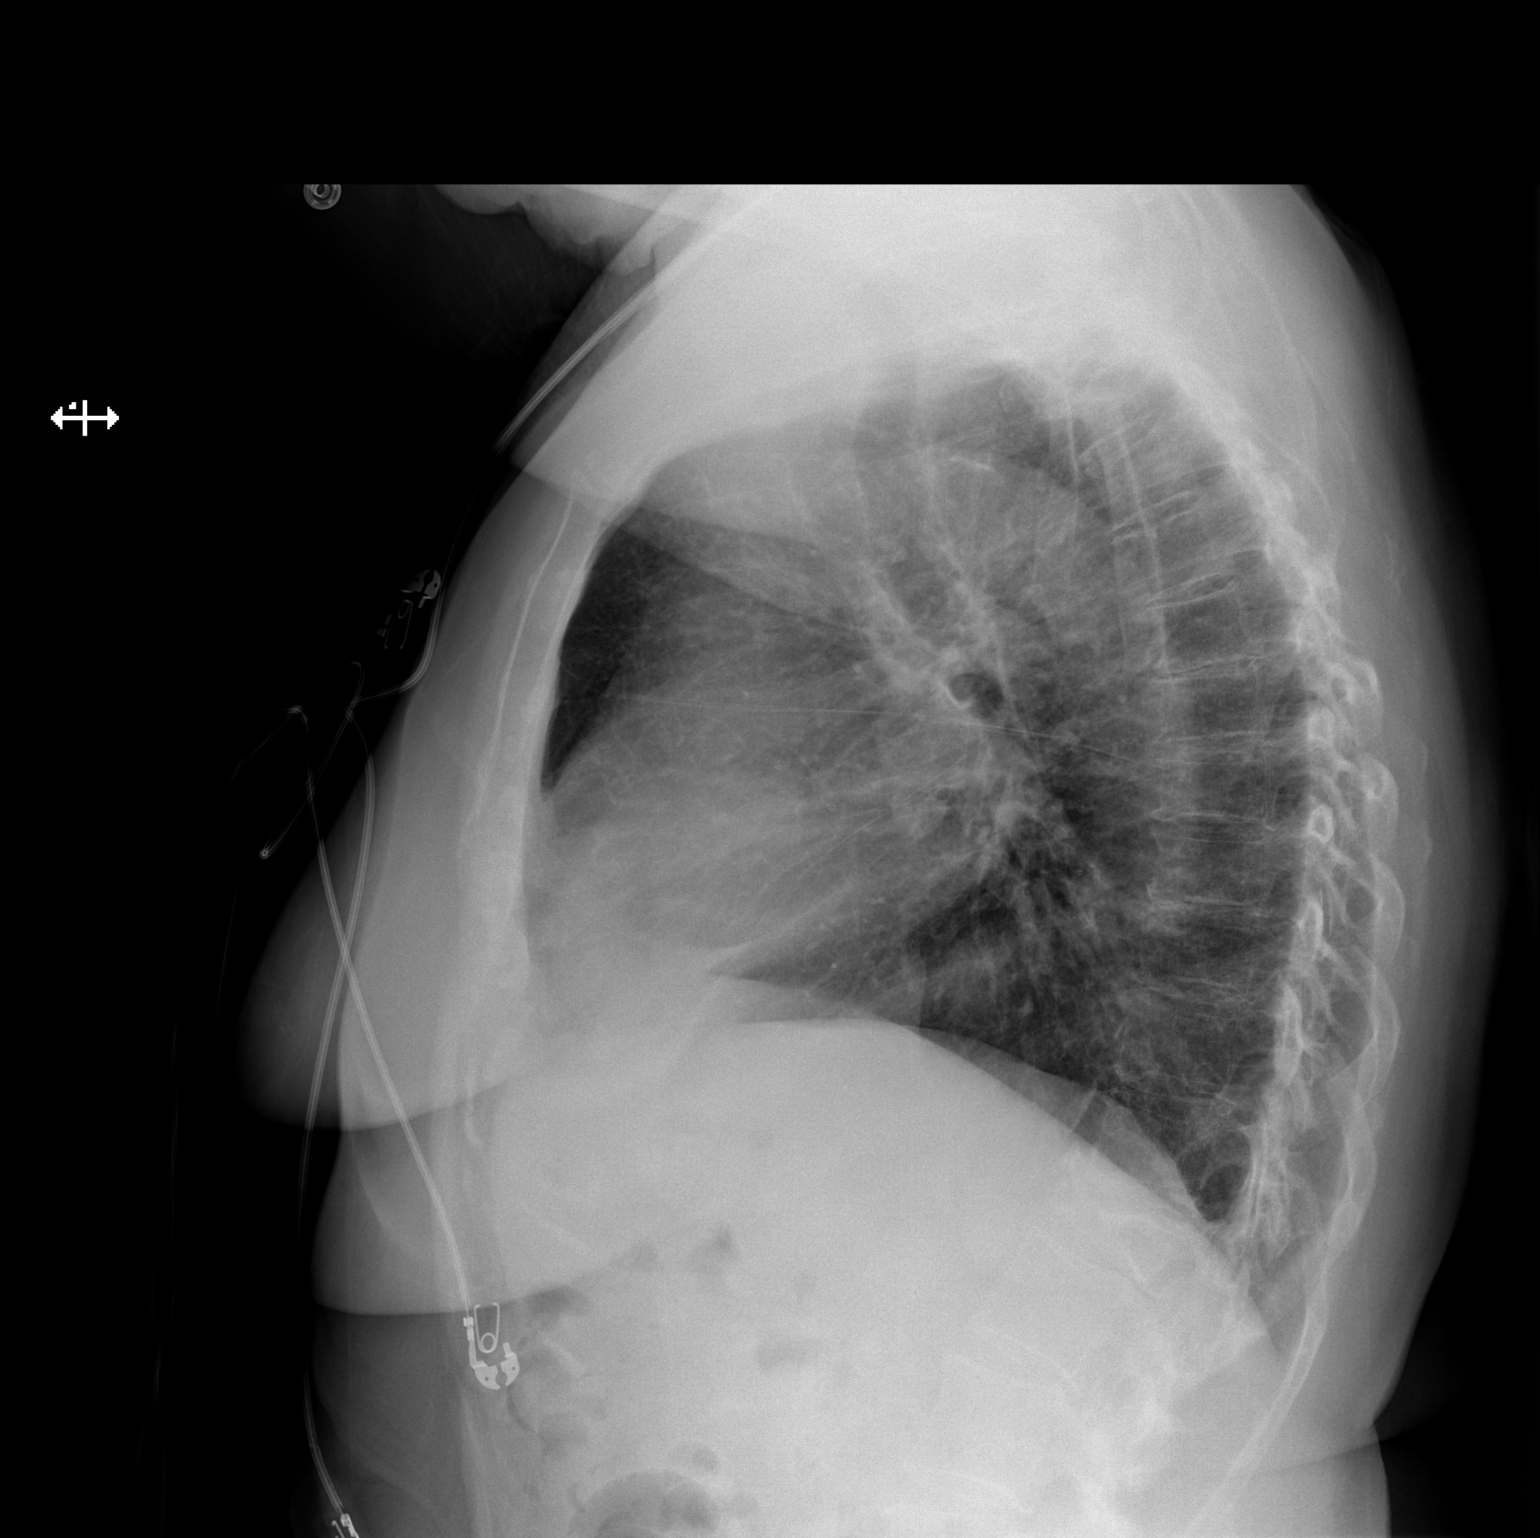

[2 of 2 positions shown; findings below may reference images not displayed]

FINDINGS: There is no edema or consolidation. Heart size and pulmonary
vascularity are normal. No adenopathy. There is aortic
atherosclerosis. There is degenerative change in each shoulder with
evidence of avascular necrosis in each humeral head.
IMPRESSION: No edema or consolidation. Heart size normal. There is aortic
atherosclerosis. Avascular necrosis noted in each humeral head.

Aortic Atherosclerosis ([id number]).

## 2022-07-21 DEATH — deceased
